# Patient Record
Sex: Male | Born: 1980 | Race: Black or African American | Hispanic: No | Marital: Single | Smoking: Never smoker
Health system: Southern US, Community
[De-identification: ages and names within clinical notes are randomized; demographics above are authoritative.]

---
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
## 2006-11-30 ENCOUNTER — Emergency Department (HOSPITAL_COMMUNITY): Admission: EM | Admit: 2006-11-30 | Discharge: 2006-12-01 | Payer: Self-pay | Admitting: Emergency Medicine

## 2006-12-07 ENCOUNTER — Emergency Department (HOSPITAL_COMMUNITY): Admission: EM | Admit: 2006-12-07 | Discharge: 2006-12-07 | Payer: Self-pay | Admitting: Emergency Medicine

## 2009-05-01 ENCOUNTER — Emergency Department (HOSPITAL_COMMUNITY): Admission: EM | Admit: 2009-05-01 | Discharge: 2009-05-01 | Payer: Self-pay | Admitting: Family Medicine

## 2013-09-21 ENCOUNTER — Emergency Department (INDEPENDENT_AMBULATORY_CARE_PROVIDER_SITE_OTHER)
Admission: EM | Admit: 2013-09-21 | Discharge: 2013-09-21 | Disposition: A | Discharge status: Home / Self Care | Payer: BC Managed Care – PPO | Source: Home / Self Care | Attending: Emergency Medicine | Admitting: Emergency Medicine

## 2013-09-21 ENCOUNTER — Encounter (HOSPITAL_COMMUNITY): Payer: Self-pay | Admitting: Emergency Medicine

## 2013-09-21 DIAGNOSIS — J029 Acute pharyngitis, unspecified: Secondary | ICD-10-CM

## 2013-09-21 LAB — POCT RAPID STREP A: Streptococcus, Group A Screen (Direct): NEGATIVE

## 2013-09-21 MED ORDER — PENICILLIN G BENZATHINE 1200000 UNIT/2ML IM SUSP
1.2000 10*6.[IU] | Freq: Once | INTRAMUSCULAR | Status: AC
  Administered 2013-09-21: 1.2 10*6.[IU] via INTRAMUSCULAR

## 2013-09-21 MED ORDER — ACETAMINOPHEN 325 MG PO TABS
650.0000 mg | ORAL_TABLET | Freq: Once | ORAL | Status: AC
  Administered 2013-09-21: 650 mg via ORAL

## 2013-09-21 MED ORDER — PENICILLIN G BENZATHINE 1200000 UNIT/2ML IM SUSP
INTRAMUSCULAR | Status: AC
  Filled 2013-09-21: qty 2

## 2013-09-21 MED ORDER — ACETAMINOPHEN 325 MG PO TABS
ORAL_TABLET | ORAL | Status: AC
  Filled 2013-09-21: qty 2

## 2013-09-21 NOTE — Discharge Instructions (Signed)
Pharyngitis °Pharyngitis is redness, pain, and swelling (inflammation) of your pharynx.  °CAUSES  °Pharyngitis is usually caused by infection. Most of the time, these infections are from viruses (viral) and are part of a cold. However, sometimes pharyngitis is caused by bacteria (bacterial). Pharyngitis can also be caused by allergies. Viral pharyngitis may be spread from person to person by coughing, sneezing, and personal items or utensils (cups, forks, spoons, toothbrushes). Bacterial pharyngitis may be spread from person to person by more intimate contact, such as kissing.  °SIGNS AND SYMPTOMS  °Symptoms of pharyngitis include:   °· Sore throat.   °· Tiredness (fatigue).   °· Low-grade fever.   °· Headache. °· Joint pain and muscle aches. °· Skin rashes. °· Swollen lymph nodes. °· Plaque-like film on throat or tonsils (often seen with bacterial pharyngitis). °DIAGNOSIS  °Your health care provider will ask you questions about your illness and your symptoms. Your medical history, along with a physical exam, is often all that is needed to diagnose pharyngitis. Sometimes, a rapid strep test is done. Other lab tests may also be done, depending on the suspected cause.  °TREATMENT  °Viral pharyngitis will usually get better in 3 4 days without the use of medicine. Bacterial pharyngitis is treated with medicines that kill germs (antibiotics).  °HOME CARE INSTRUCTIONS  °· Drink enough water and fluids to keep your urine clear or pale yellow.   °· Only take over-the-counter or prescription medicines as directed by your health care provider:   °· If you are prescribed antibiotics, make sure you finish them even if you start to feel better.   °· Do not take aspirin.   °· Get lots of rest.   °· Gargle with 8 oz of salt water (½ tsp of salt per 1 qt of water) as often as every 1 2 hours to soothe your throat.   °· Throat lozenges (if you are not at risk for choking) or sprays may be used to soothe your throat. °SEEK MEDICAL  CARE IF:  °· You have large, tender lumps in your neck. °· You have a rash. °· You cough up green, yellow-brown, or bloody spit. °SEEK IMMEDIATE MEDICAL CARE IF:  °· Your neck becomes stiff. °· You drool or are unable to swallow liquids. °· You vomit or are unable to keep medicines or liquids down. °· You have severe pain that does not go away with the use of recommended medicines. °· You have trouble breathing (not caused by a stuffy nose). °MAKE SURE YOU:  °· Understand these instructions. °· Will watch your condition. °· Will get help right away if you are not doing well or get worse. °Document Released: 03/29/2005 Document Revised: 01/17/2013 Document Reviewed: 12/04/2012 °Exit[employer]® Patient Information ©2014 [employer]. ° °

## 2013-09-21 NOTE — ED Notes (Signed)
C/o sore throat  States he doe have a headache, vomiting on Wednesday Andrew Galvan was taking for headache relief.  Patient is febrile 101

## 2013-09-21 NOTE — ED Provider Notes (Signed)
Medical screening examination/treatment/procedure(s) were performed by non-physician practitioner and as supervising physician I was immediately available for consultation/collaboration.  Yolanda Dockendorf, M.D.  Izak Anding C Marcell Chavarin, MD 09/21/13 2159 

## 2013-09-21 NOTE — ED Provider Notes (Signed)
CSN: [id number]     Arrival date & time 09/21/13  0804 History   First MD Initiated Contact with Patient 09/21/13 0825     Chief Complaint  Patient presents with  . Sore Throat   (Consider location/radiation/quality/duration/timing/severity/associated sxs/prior Treatment) HPI Comments: Both wife and son currently being treated for strep pharyngitis  Patient is a 33 y.o. male presenting with pharyngitis. The history is provided by the patient.  Sore Throat This is a new problem. Episode onset: began 3 days ago. The problem occurs constantly. The problem has been gradually worsening. Associated symptoms include headaches. Associated symptoms comments: +fever.    History reviewed. No pertinent past medical history. No past surgical history on file. No family history on file. History  Substance Use Topics  . Smoking status: Not on file  . Smokeless tobacco: Not on file  . Alcohol Use: Not on file    Review of Systems  Neurological: Positive for headaches.  All other systems reviewed and are negative.   Allergies  Review of patient's allergies indicates no known allergies.  Home Medications   Prior to Admission medications   Not on File   BP 147/83  Pulse 90  Temp(Src) 101.1 F (38.4 C) (Oral)  Resp 14  SpO2 99% Physical Exam  Nursing note and vitals reviewed. Constitutional: He is oriented to person, place, and time. He appears well-developed and well-nourished. No distress.  HENT:  Head: Normocephalic and atraumatic.  Right Ear: Hearing and external ear normal.  Left Ear: Hearing and external ear normal.  Mouth/Throat: Uvula is midline and mucous membranes are normal. Posterior oropharyngeal erythema present. No oropharyngeal exudate, posterior oropharyngeal edema or tonsillar abscesses.  Eyes: Conjunctivae are normal. No scleral icterus.  Neck: Normal range of motion. Neck supple.  Cardiovascular: Normal rate, regular rhythm and normal heart sounds.    Pulmonary/Chest: Effort normal and breath sounds normal.  Musculoskeletal: Normal range of motion.  Lymphadenopathy:    He has no cervical adenopathy.  Neurological: He is alert and oriented to person, place, and time.  Skin: Skin is warm and dry. No rash noted. No erythema.  Psychiatric: He has a normal mood and affect. His behavior is normal.    ED Course  Procedures (including critical care time) Labs Review Labs Reviewed  POCT RAPID STREP A ([facility] CARE ONLY)    Imaging Review No results found.   MDM   1. Pharyngitis   Given both his son and his wife have recently had throat cultures that were positive for strep, will treat patient empirically with 1.2 MU bicillin LA and advise follow up if no improvement.     Jess BartersJennifer Tondreau Bay [place], [place] 09/21/13 [phone]

## 2013-09-23 LAB — CULTURE, GROUP A STREP

## 2015-02-05 ENCOUNTER — Encounter (HOSPITAL_COMMUNITY): Payer: Self-pay

## 2015-02-05 ENCOUNTER — Emergency Department (HOSPITAL_COMMUNITY)
Admission: EM | Admit: 2015-02-05 | Discharge: 2015-02-05 | Disposition: A | Discharge status: Home / Self Care | Payer: BLUE CROSS/BLUE SHIELD | Attending: Emergency Medicine | Admitting: Emergency Medicine

## 2015-02-05 DIAGNOSIS — M545 Low back pain: Secondary | ICD-10-CM | POA: Diagnosis present

## 2015-02-05 DIAGNOSIS — M25512 Pain in left shoulder: Secondary | ICD-10-CM | POA: Insufficient documentation

## 2015-02-05 DIAGNOSIS — M5432 Sciatica, left side: Secondary | ICD-10-CM

## 2015-02-05 DIAGNOSIS — R197 Diarrhea, unspecified: Secondary | ICD-10-CM

## 2015-02-05 MED ORDER — PREDNISONE 20 MG PO TABS: 40.0000 mg | ORAL_TABLET | Freq: Every day | ORAL | Status: DC

## 2015-02-05 NOTE — Discharge Instructions (Signed)
Shoulder Pain The shoulder is the joint that connects your arms to your body. The bones that form the shoulder joint include the upper arm bone (humerus), the shoulder blade (scapula), and the collarbone (clavicle). The top of the humerus is shaped like a ball and fits into a rather flat socket on the scapula (glenoid cavity). A combination of muscles and strong, fibrous tissues that connect muscles to bones (tendons) support your shoulder joint and hold the ball in the socket. Small, fluid-filled sacs (bursae) are located in different areas of the joint. They act as cushions between the bones and the overlying soft tissues and help reduce friction between the gliding tendons and the bone as you move your arm. Your shoulder joint allows a wide range of motion in your arm. This range of motion allows you to do things like scratch your back or throw a ball. However, this range of motion also makes your shoulder more prone to pain from overuse and injury. Causes of shoulder pain can originate from both injury and overuse and usually can be grouped in the following four categories:  Redness, swelling, and pain (inflammation) of the tendon (tendinitis) or the bursae (bursitis).  Instability, such as a dislocation of the joint.  Inflammation of the joint (arthritis).  Broken bone (fracture). HOME CARE INSTRUCTIONS   Apply ice to the sore area.  Put ice in a plastic bag.  Place a towel between your skin and the bag.  Leave the ice on for 15-20 minutes, 3-4 times per day for the first 2 days, or as directed by your health care provider.  Stop using cold packs if they do not help with the pain.  If you have a shoulder sling or immobilizer, wear it as long as your caregiver instructs. Only remove it to shower or bathe. Move your arm as little as possible, but keep your hand moving to prevent swelling.  Squeeze a soft ball or foam pad as much as possible to help prevent swelling.  Only take  over-the-counter or prescription medicines for pain, discomfort, or fever as directed by your caregiver. SEEK MEDICAL CARE IF:   Your shoulder pain increases, or new pain develops in your arm, hand, or fingers.  Your hand or fingers become cold and numb.  Your pain is not relieved with medicines. SEEK IMMEDIATE MEDICAL CARE IF:   Your arm, hand, or fingers are numb or tingling.  Your arm, hand, or fingers are significantly swollen or turn white or blue. MAKE SURE YOU:   Understand these instructions.  Will watch your condition.  Will get help right away if you are not doing well or get worse.   This information is not intended to replace advice given to you by your health care provider. Make sure you discuss any questions you have with your health care provider.   Document Released: 01/06/2005 Document Revised: 04/19/2014 Document Reviewed: 07/22/2014 Elsevier Interactive Patient Education 2016 [employer]  Radicular Pain Radicular pain in either the arm or leg is usually from a bulging or herniated disk in the spine. A piece of the herniated disk may press against the nerves as the nerves exit the spine. This causes pain which is felt at the tips of the nerves down the arm or leg. Other causes of radicular pain may include:  Fractures.  Heart disease.  Cancer.  An abnormal and usually degenerative state of the nervous system or nerves (neuropathy). Diagnosis may require CT or MRI scanning to determine the primary  cause.  Nerves that start at the neck (nerve roots) may cause radicular pain in the outer shoulder and arm. It can spread down to the thumb and fingers. The symptoms vary depending on which nerve root has been affected. In most cases radicular pain improves with conservative treatment. Neck problems may require physical therapy, a neck collar, or cervical traction. Treatment may take many weeks, and surgery may be considered if the symptoms do not improve.    Conservative treatment is also recommended for sciatica. Sciatica causes pain to radiate from the lower back or buttock area down the leg into the foot. Often there is a history of back problems. Most patients with sciatica are better after 2 to 4 weeks of rest and other supportive care. Short term bed rest can reduce the disk pressure considerably. Sitting, however, is not a good position since this increases the pressure on the disk. You should avoid bending, lifting, and all other activities which make the problem worse. Traction can be used in severe cases. Surgery is usually reserved for patients who do not improve within the first months of treatment. Only take over-the-counter or prescription medicines for pain, discomfort, or fever as directed by your caregiver. Narcotics and muscle relaxants may help by relieving more severe pain and spasm and by providing mild sedation. Cold or massage can give significant relief. Spinal manipulation is not recommended. It can increase the degree of disc protrusion. Epidural steroid injections are often effective treatment for radicular pain. These injections deliver medicine to the spinal nerve in the space between the protective covering of the spinal cord and back bones (vertebrae). Your caregiver can give you more information about steroid injections. These injections are most effective when given within two weeks of the onset of pain.  You should see your caregiver for follow up care as recommended. A program for neck and back injury rehabilitation with stretching and strengthening exercises is an important part of management.  SEEK IMMEDIATE MEDICAL CARE IF:  You develop increased pain, weakness, or numbness in your arm or leg.  You develop difficulty with bladder or bowel control.  You develop abdominal pain.   This information is not intended to replace advice given to you by your health care provider. Make sure you discuss any questions you have with  your health care provider.   Document Released: 05/06/2004 Document Revised: 04/19/2014 Document Reviewed: 10/23/2014 Elsevier Interactive Patient Education 2016 [employer] Inc[employer]  Diarrhea Diarrhea is frequent loose and watery bowel movements. It can cause you to feel weak and dehydrated. Dehydration can cause you to become tired and thirsty, have a dry mouth, and have decreased urination that often is dark yellow. Diarrhea is a sign of another problem, most often an infection that will not last long. In most cases, diarrhea typically lasts 2-3 days. However, it can last longer if it is a sign of something more serious. It is important to treat your diarrhea as directed by your caregiver to lessen or prevent future episodes of diarrhea. CAUSES  Some common causes include:  Gastrointestinal infections caused by viruses, bacteria, or parasites.  Food poisoning or food allergies.  Certain medicines, such as antibiotics, chemotherapy, and laxatives.  Artificial sweeteners and fructose.  Digestive disorders. HOME CARE INSTRUCTIONS  Ensure adequate fluid intake (hydration): Have 1 cup (8 oz) of fluid for each diarrhea episode. Avoid fluids that contain simple sugars or sports drinks, fruit juices, whole milk products, and sodas. Your urine should be clear or pale yellow if  you are drinking enough fluids. Hydrate with an oral rehydration solution that you can purchase at pharmacies, retail stores, and online. You can prepare an oral rehydration solution at home by mixing the following ingredients together:   - tsp table salt.   tsp baking soda.   tsp salt substitute containing potassium chloride.  1  tablespoons sugar.  1 L (34 oz) of water.  Certain foods and beverages may increase the speed at which food moves through the gastrointestinal (GI) tract. These foods and beverages should be avoided and include:  Caffeinated and alcoholic beverages.  High-fiber foods, such as raw fruits and  vegetables, nuts, seeds, and whole grain breads and cereals.  Foods and beverages sweetened with sugar alcohols, such as xylitol, sorbitol, and mannitol.  Some foods may be well tolerated and may help thicken stool including:  Starchy foods, such as rice, toast, pasta, low-sugar cereal, oatmeal, grits, baked potatoes, crackers, and bagels.  Bananas.  Applesauce.  Add probiotic-rich foods to help increase healthy bacteria in the GI tract, such as yogurt and fermented milk products.  Wash your hands well after each diarrhea episode.  Only take over-the-counter or prescription medicines as directed by your caregiver.  Take a warm bath to relieve any burning or pain from frequent diarrhea episodes. SEEK IMMEDIATE MEDICAL CARE IF:   You are unable to keep fluids down.  You have persistent vomiting.  You have blood in your stool, or your stools are black and tarry.  You do not urinate in 6-8 hours, or there is only a small amount of very dark urine.  You have abdominal pain that increases or localizes.  You have weakness, dizziness, confusion, or light-headedness.  You have a severe headache.  Your diarrhea gets worse or does not get better.  You have a fever or persistent symptoms for more than 2-3 days.  You have a fever and your symptoms suddenly get worse. MAKE SURE YOU:   Understand these instructions.  Will watch your condition.  Will get help right away if you are not doing well or get worse.   This information is not intended to replace advice given to you by your health care provider. Make sure you discuss any questions you have with your health care provider.   Document Released: 03/19/2002 Document Revised: 04/19/2014 Document Reviewed: 12/05/2011 Elsevier Interactive Patient Education [employer]

## 2015-02-05 NOTE — ED Provider Notes (Signed)
CSN: [id number]     Arrival date & time 02/05/15  1012 History   First MD Initiated Contact with Patient 02/05/15 1037     Chief Complaint  Patient presents with  . Leg Pain  . Back Pain  . Diarrhea     (Consider location/radiation/quality/duration/timing/severity/associated sxs/prior Treatment) HPI Comments: Patient presents to the emergency department with chief complaint of multiple complaints.  1. Low back pain and leg pain: Patient states that he has been having these symptoms for the past 3 weeks. He denies any associated bowel or bladder incontinence. States that he performs heavy lifting at work. He rates his pain as a 7 out of 10. Symptoms are aggravated with movement. Denies taking anything for her symptoms. Denies any history of back surgery.  2. Left shoulder pain: Patient reports left shoulder pain 3 weeks. Denies any known mechanism of injury. Person with movement.  Describes symptoms as a "soreness." Has not taken anything for his symptoms.  3. Diarrhea:  Reports diarrhea 3 days ago.  Symptoms have resolved.  No abdominal pain or associated symptoms.  The history is provided by the patient. No language interpreter was used.    History reviewed. No pertinent past medical history. History reviewed. No pertinent past surgical history. History reviewed. No pertinent family history. Social History  Substance Use Topics  . Smoking status: Never Smoker   . Smokeless tobacco: None  . Alcohol Use: Yes     Comment: occ    Review of Systems  Constitutional: Negative for fever and chills.  Gastrointestinal:       No bowel incontinence  Genitourinary:       No urinary incontinence  Musculoskeletal: Positive for myalgias, back pain and arthralgias.  Neurological:       No saddle anesthesia  All other systems reviewed and are negative.     Allergies  Dairy aid  Home Medications   Prior to Admission medications   Medication Sig Start Date End Date Taking?  Authorizing Provider  acetaminophen (TYLENOL) 500 MG tablet Take 1,000 mg by mouth every 6 (six) hours as needed (For pain.).   Yes Historical Provider, MD   BP 144/79 mmHg  Pulse 55  Temp(Src) 97.9 F (36.6 C) (Oral)  Resp 14  SpO2 100% Physical Exam  Constitutional: He is oriented to person, place, and time. He appears well-developed and well-nourished. No distress.  HENT:  Head: Normocephalic and atraumatic.  Eyes: Conjunctivae and EOM are normal. Right eye exhibits no discharge. Left eye exhibits no discharge. No scleral icterus.  Neck: Normal range of motion. Neck supple. No tracheal deviation present.  Cardiovascular: Normal rate, regular rhythm and normal heart sounds.  Exam reveals no gallop and no friction rub.   No murmur heard. Pulmonary/Chest: Effort normal and breath sounds normal. No respiratory distress. He has no wheezes.  Abdominal: Soft. He exhibits no distension. There is no tenderness.  Musculoskeletal: Normal range of motion.  Lumbar paraspinal muscles tender to palpation, no bony tenderness, step-offs, or gross abnormality or deformity of spine, patient is able to ambulate, moves all extremities  Bilateral great toe extension intact Bilateral plantar/dorsiflexion intact  Left shoulder ROM and strength 5/5, negative empty can test, negative Hawkins-Kennedy test, no bony abnormality or deformity  Neurological: He is alert and oriented to person, place, and time. He has normal reflexes.  Sensation and strength intact bilaterally Symmetrical reflexes  Skin: Skin is warm. He is not diaphoretic.  Psychiatric: He has a normal mood and affect. His behavior  is normal. Judgment and thought content normal.  Nursing note and vitals reviewed.   ED Course  Procedures (including critical care time)   MDM   Final diagnoses:  Sciatica of left side  Left shoulder pain  Diarrhea, unspecified type    Patient with multiple complaints.    1. Back pain and leg pain seem  to be sciatica.  No cauda equina symptoms.  No fever.  Will give prednisone and recommend ortho follow-up.  2. Left shoulder pain seems musculoskeletal.  No indication for imaging in ED based on normal ROM and strength and no tenderness to palpation.  Recommend ortho follow-up.  3. Diarrhea has resolved.  No abdominal pain.  VSS.    Roxy Horsemanobert Unique Searfoss, PA-C 02/05/15 1125  Eber HongBrian Miller, MD 02/06/15 [phone]

## 2015-02-05 NOTE — ED Notes (Signed)
Pt c/o intermittent lower back pain and L shoulder pain x 3 weeks, L leg pain x 2 days, and diarrhea x 3 days.  Pain score 7/10.  Pt has not taken anything for pain.  Denies injury.  Denies GU complaint.  Denies abdominal pain and n/v. Denies numbness and tingling.

## 2015-06-09 ENCOUNTER — Encounter (HOSPITAL_COMMUNITY): Payer: Self-pay

## 2015-06-09 ENCOUNTER — Emergency Department (HOSPITAL_COMMUNITY)
Admission: EM | Admit: 2015-06-09 | Discharge: 2015-06-09 | Disposition: A | Discharge status: Home / Self Care | Payer: BLUE CROSS/BLUE SHIELD | Attending: Emergency Medicine | Admitting: Emergency Medicine

## 2015-06-09 DIAGNOSIS — S4992XA Unspecified injury of left shoulder and upper arm, initial encounter: Secondary | ICD-10-CM | POA: Diagnosis present

## 2015-06-09 DIAGNOSIS — Y998 Other external cause status: Secondary | ICD-10-CM | POA: Diagnosis not present

## 2015-06-09 DIAGNOSIS — Z7952 Long term (current) use of systemic steroids: Secondary | ICD-10-CM | POA: Insufficient documentation

## 2015-06-09 DIAGNOSIS — Y9389 Activity, other specified: Secondary | ICD-10-CM | POA: Insufficient documentation

## 2015-06-09 DIAGNOSIS — Y92410 Unspecified street and highway as the place of occurrence of the external cause: Secondary | ICD-10-CM | POA: Diagnosis not present

## 2015-06-09 DIAGNOSIS — S3992XA Unspecified injury of lower back, initial encounter: Secondary | ICD-10-CM | POA: Diagnosis not present

## 2015-06-09 DIAGNOSIS — S40212A Abrasion of left shoulder, initial encounter: Secondary | ICD-10-CM | POA: Diagnosis not present

## 2015-06-09 DIAGNOSIS — M549 Dorsalgia, unspecified: Secondary | ICD-10-CM

## 2015-06-09 DIAGNOSIS — S29002A Unspecified injury of muscle and tendon of back wall of thorax, initial encounter: Secondary | ICD-10-CM | POA: Diagnosis not present

## 2015-06-09 DIAGNOSIS — S199XXA Unspecified injury of neck, initial encounter: Secondary | ICD-10-CM | POA: Diagnosis not present

## 2015-06-09 DIAGNOSIS — S29001A Unspecified injury of muscle and tendon of front wall of thorax, initial encounter: Secondary | ICD-10-CM | POA: Insufficient documentation

## 2015-06-09 MED ORDER — CYCLOBENZAPRINE HCL 10 MG PO TABS
10.0000 mg | ORAL_TABLET | Freq: Two times a day (BID) | ORAL | Status: DC | PRN
Start: 2015-06-09 — End: 2017-09-12

## 2015-06-09 MED ORDER — IBUPROFEN 800 MG PO TABS: 800.0000 mg | ORAL_TABLET | Freq: Three times a day (TID) | ORAL | Status: DC

## 2015-06-09 MED ORDER — TRAMADOL HCL 50 MG PO TABS: 50.0000 mg | ORAL_TABLET | Freq: Four times a day (QID) | ORAL | Status: AC | PRN

## 2015-06-09 NOTE — ED Notes (Signed)
Patient was alert, oriented and stable upon discharge. RN went over AVS and patient had no further questions.  
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
## 2015-12-25 ENCOUNTER — Encounter (HOSPITAL_COMMUNITY): Payer: Self-pay | Admitting: Emergency Medicine

## 2015-12-25 ENCOUNTER — Ambulatory Visit (HOSPITAL_COMMUNITY)
Admission: EM | Admit: 2015-12-25 | Discharge: 2015-12-25 | Discharge status: Against Medical Advice | Payer: BLUE CROSS/BLUE SHIELD

## 2015-12-25 NOTE — ED Notes (Signed)
Pt came outside and reports he needed to leave.... Had to p/u children.

## 2015-12-25 NOTE — ED Triage Notes (Signed)
Pt c/o intermittent rash on random parts of body... Today rash is on neck  Denies fevers, chills.... A&O x4... NAD

## 2016-01-21 ENCOUNTER — Encounter (HOSPITAL_COMMUNITY): Payer: Self-pay

## 2016-01-21 ENCOUNTER — Emergency Department (HOSPITAL_COMMUNITY)
Admission: EM | Admit: 2016-01-21 | Discharge: 2016-01-21 | Disposition: A | Discharge status: Home / Self Care | Payer: BLUE CROSS/BLUE SHIELD | Attending: Emergency Medicine | Admitting: Emergency Medicine

## 2016-01-21 DIAGNOSIS — L509 Urticaria, unspecified: Secondary | ICD-10-CM | POA: Insufficient documentation

## 2016-01-21 DIAGNOSIS — R1033 Periumbilical pain: Secondary | ICD-10-CM | POA: Diagnosis not present

## 2016-01-21 DIAGNOSIS — R319 Hematuria, unspecified: Secondary | ICD-10-CM | POA: Diagnosis not present

## 2016-01-21 DIAGNOSIS — Z791 Long term (current) use of non-steroidal anti-inflammatories (NSAID): Secondary | ICD-10-CM | POA: Diagnosis not present

## 2016-01-21 DIAGNOSIS — R21 Rash and other nonspecific skin eruption: Secondary | ICD-10-CM | POA: Diagnosis present

## 2016-01-21 LAB — URINALYSIS, ROUTINE W REFLEX MICROSCOPIC
Bilirubin Urine: NEGATIVE
Glucose, UA: NEGATIVE mg/dL
Hgb urine dipstick: NEGATIVE
Ketones, ur: NEGATIVE mg/dL
Leukocytes, UA: NEGATIVE
Nitrite: NEGATIVE
Protein, ur: 30 mg/dL — AB
Specific Gravity, Urine: 1.022 (ref 1.005–1.030)
pH: 6 (ref 5.0–8.0)

## 2016-01-21 LAB — URINE MICROSCOPIC-ADD ON: Squamous Epithelial / LPF: NONE SEEN

## 2016-01-21 MED ORDER — PREDNISONE 10 MG PO TABS: 20.0000 mg | ORAL_TABLET | Freq: Every day | ORAL | 0 refills | Status: DC

## 2016-01-21 MED ORDER — PREDNISONE 20 MG PO TABS: 60.0000 mg | ORAL_TABLET | Freq: Every day | ORAL | 0 refills | Status: AC

## 2016-01-21 MED ORDER — PREDNISONE 20 MG PO TABS: 60.0000 mg | ORAL_TABLET | Freq: Every day | ORAL | 0 refills | Status: DC

## 2016-01-21 NOTE — Progress Notes (Addendum)
Pt has a rash on both arms and his legs. Pt does not appear in distress. Urine sent(12:45p)Waiting for PA to verfiy prescriptions. (1:45p)

## 2016-01-21 NOTE — ED Triage Notes (Signed)
Pt here with rash leg.  Pt has had off/on x 2 months

## 2016-01-21 NOTE — Discharge Instructions (Signed)
Medications: Prednisone  Treatment: Take prednisone as prescribed for 5 days. Continue taking Claritin and Benadryl as prescribed over-the-counter. You will be called if your urine returns positive for any disease that required treatment. If you are positive, please follow-up with your primary care provider's office or the health department for treatment.  Follow-up: Please follow-up with your primary care provider your scheduled appointment. He may need allergy testing to determine why you are having this allergic rash. Please return to emergency department if you develop any new or worsening symptoms.

## 2016-01-21 NOTE — ED Notes (Signed)
Bed: WA26 Expected date:  Expected time:  Means of arrival:  Comments: 

## 2016-01-21 NOTE — ED Provider Notes (Signed)
[facility]-EMERGENCY DEPT Provider Note   CSN: [id number] Arrival date & time: 01/21/16  1026  By signing my name below, I, Andrew Galvan, attest that this documentation has been prepared under the direction and in the presence of Andrew Electriclexandra Harneet Noblett, PA-C.  Electronically Signed: Placido SouLogan Galvan, ED Scribe. 01/21/16. 12:28 PM.    History   Chief Complaint Chief Complaint  Patient presents with  . Rash    HPI HPI Comments: Barton FannyJames M Andrew Galvan is a 35 y.o. male who presents to the Emergency Department complaining of an intermittent, mild, rash x 2 months. He states his rash intermittently occurs across his back and extremities and presents on a nearly daily basis. Pt states he has taken Zofran, Claritin, benadryl, and applied calamine lotion w/o long term relief. Pt reports associated itchiness and burning pain across the affected regions. He denies any new foods, clothing, soaps or detergents. He also complains of mild periumbilical pain that only presents with palpation and he describes as soreness. He states he works at [employer] and does a lot of lifting. Patient is also concerned one occurrence of hematuria last week stating that he "urinated and noticed one drop of blood". Pt denies a h/o STD. He has an appointment with a new PCP on 02/02/2016. He denies any other urinary issues or other associated symptoms at this time.   The history is provided by the patient. No language interpreter was used.    History reviewed. No pertinent past medical history.  There are no active problems to display for this patient.   History reviewed. No pertinent surgical history.   Home Medications    Prior to Admission medications   Medication Sig Start Date End Date Taking? Authorizing Provider  acetaminophen (TYLENOL) 500 MG tablet Take 1,000 mg by mouth every 6 (six) hours as needed (For pain.).    Historical Provider, MD  cyclobenzaprine (FLEXERIL) 10 MG tablet Take 1 tablet (10 mg total) by mouth 2 (two) times  daily as needed for muscle spasms. 06/09/15   Danelle BerryLeisa Tapia, PA-C  ibuprofen (ADVIL,MOTRIN) 800 MG tablet Take 1 tablet (800 mg total) by mouth 3 (three) times daily. 06/09/15   Danelle BerryLeisa Tapia, PA-C  predniSONE (DELTASONE) 20 MG tablet Take 3 tablets (60 mg total) by mouth daily. 01/21/16   Emi HolesAlexandra M Denzell Colasanti, PA-C  traMADol (ULTRAM) 50 MG tablet Take 1 tablet (50 mg total) by mouth every 6 (six) hours as needed. 06/09/15   Danelle BerryLeisa Tapia, PA-C    Family History Family History  Problem Relation Age of Onset  . Cancer Mother     Social History Social History  Substance Use Topics  . Smoking status: Never Smoker  . Smokeless tobacco: Never Used  . Alcohol use Yes     Comment: occ     Allergies   Dairy aid [lactase]   Review of Systems Review of Systems  Constitutional: Negative for chills and fever.  HENT: Negative for drooling and trouble swallowing.   Respiratory: Negative for cough, shortness of breath, wheezing and stridor.   Gastrointestinal: Positive for abdominal pain. Negative for nausea and vomiting.  Genitourinary: Positive for hematuria (1 episode, now resolved). Negative for difficulty urinating, discharge, dysuria, frequency, penile pain, penile swelling, scrotal swelling, testicular pain and urgency.  Musculoskeletal: Positive for myalgias.  Skin: Positive for color change and rash.   Physical Exam Updated Vital Signs BP 136/96 (BP Location: Right Arm)   Pulse 77   Temp 98.9 F (37.2 C) (Oral)   Resp 14  Ht 5\' 5"  (1.651 m)   Wt 70.3 kg   SpO2 100%   BMI 25.79 kg/m   Physical Exam  Constitutional: He appears well-developed and well-nourished. No distress.  HENT:  Head: Normocephalic and atraumatic.  Mouth/Throat: Oropharynx is clear and moist. No oropharyngeal exudate.  Patent airway; no swelling of the lips, tongue, pharynx; no lesions in the mouth  Eyes: Conjunctivae are normal. Pupils are equal, round, and reactive to light. Right eye exhibits no discharge.  Left eye exhibits no discharge. No scleral icterus.  Neck: Normal range of motion. Neck supple. No thyromegaly present.  Cardiovascular: Normal rate, regular rhythm, normal heart sounds and intact distal pulses.  Exam reveals no gallop and no friction rub.   No murmur heard. Pulmonary/Chest: Effort normal and breath sounds normal. No stridor. No respiratory distress. He has no wheezes. He has no rales.  Abdominal: Soft. Bowel sounds are normal. He exhibits no distension. There is no rebound and no guarding.    Mild tenderness (patient described as soreness) to the periumbilical area  Musculoskeletal: He exhibits no edema.  Lymphadenopathy:    He has no cervical adenopathy.  Neurological: He is alert. Coordination normal.  Skin: Skin is warm and dry. No rash noted. He is not diaphoretic. No pallor.  Psychiatric: He has a normal mood and affect.  Nursing note and vitals reviewed.        ED Treatments / Results  Labs (all labs ordered are listed, but only abnormal results are displayed) Labs Reviewed  URINALYSIS, ROUTINE W REFLEX MICROSCOPIC (NOT AT [facility]) - Abnormal; Notable for the following:       Result Value   APPearance CLOUDY (*)    Protein, ur 30 (*)    All other components within normal limits  URINE MICROSCOPIC-ADD ON - Abnormal; Notable for the following:    Bacteria, UA MANY (*)    All other components within normal limits  GC/CHLAMYDIA PROBE AMP ([place]) NOT AT [facility]    EKG  EKG Interpretation None       Radiology No results found.  Procedures Procedures  DIAGNOSTIC STUDIES: Oxygen Saturation is 100% on RA, normal by my interpretation.    COORDINATION OF CARE: 12:28 PM Discussed next steps with pt. Pt verbalized understanding and is agreeable with the plan.    Medications Ordered in ED Medications - No data to display   Initial Impression / Assessment and Plan / ED Course  I have reviewed the triage vital signs and the nursing  notes.  Pertinent labs & imaging results that were available during my care of the patient were reviewed by me and considered in my medical decision making (see chart for details).  Clinical Course    Patient with urticaria of unknown origin. Patient's abdominal tenderness most likely muscular, as it is not present unless it is palpated. Patient describes this as soreness. We'll treat patient's urticaria with 5 day burst of prednisone. Patient advised to follow-up with PCP at scheduled appointment and ask for allergy testing. UA shows many bacteria, however no hematuria, leukocytes, nitrites, or squamous epithelial cells. Patient has no concern for STD exposure, however GC/chlamydia urine sent. Patient advised he will be called in 2-3 days for positive results. He was advised to seek treatment at his PCP or Health Department if positive. Strict return precautions given for above symptoms. Patient understands and agrees with plan. Patient vitals stable throughout ED course and discharged in satisfactory condition.  I personally performed the services described in  this documentation, which was scribed in my presence. The recorded information has been reviewed and is accurate.  Final Clinical Impressions(s) / ED Diagnoses   Final diagnoses:  Urticaria    New Prescriptions Discharge Medication List as of 01/21/2016  1:40 PM       Emi Holes, PA-C 01/21/16 1610    Pricilla Loveless, MD 01/27/16 1818

## 2016-03-12 ENCOUNTER — Ambulatory Visit: Payer: Self-pay | Admitting: Allergy

## 2016-05-03 ENCOUNTER — Emergency Department (HOSPITAL_COMMUNITY)
Admission: EM | Admit: 2016-05-03 | Discharge: 2016-05-03 | Disposition: A | Discharge status: Home / Self Care | Payer: BLUE CROSS/BLUE SHIELD | Attending: Emergency Medicine | Admitting: Emergency Medicine

## 2016-05-03 ENCOUNTER — Emergency Department (HOSPITAL_COMMUNITY): Payer: BLUE CROSS/BLUE SHIELD

## 2016-05-03 ENCOUNTER — Encounter (HOSPITAL_COMMUNITY): Payer: Self-pay | Admitting: Emergency Medicine

## 2016-05-03 DIAGNOSIS — S62630A Displaced fracture of distal phalanx of right index finger, initial encounter for closed fracture: Secondary | ICD-10-CM | POA: Diagnosis not present

## 2016-05-03 DIAGNOSIS — S61309A Unspecified open wound of unspecified finger with damage to nail, initial encounter: Secondary | ICD-10-CM

## 2016-05-03 DIAGNOSIS — Y939 Activity, unspecified: Secondary | ICD-10-CM | POA: Insufficient documentation

## 2016-05-03 DIAGNOSIS — S6991XA Unspecified injury of right wrist, hand and finger(s), initial encounter: Secondary | ICD-10-CM | POA: Diagnosis present

## 2016-05-03 DIAGNOSIS — Y929 Unspecified place or not applicable: Secondary | ICD-10-CM | POA: Insufficient documentation

## 2016-05-03 DIAGNOSIS — W231XXA Caught, crushed, jammed, or pinched between stationary objects, initial encounter: Secondary | ICD-10-CM | POA: Diagnosis not present

## 2016-05-03 DIAGNOSIS — Z79899 Other long term (current) drug therapy: Secondary | ICD-10-CM | POA: Diagnosis not present

## 2016-05-03 DIAGNOSIS — Y990 Civilian activity done for income or pay: Secondary | ICD-10-CM | POA: Diagnosis not present

## 2016-05-03 DIAGNOSIS — S62639B Displaced fracture of distal phalanx of unspecified finger, initial encounter for open fracture: Secondary | ICD-10-CM

## 2016-05-03 MED ORDER — HYDROCODONE-ACETAMINOPHEN 5-325 MG PO TABS: 2.0000 | ORAL_TABLET | ORAL | 0 refills | Status: DC | PRN

## 2016-05-03 MED ORDER — CEPHALEXIN 250 MG PO CAPS
500.0000 mg | ORAL_CAPSULE | Freq: Once | ORAL | Status: AC
  Administered 2016-05-03: 500 mg via ORAL
  Filled 2016-05-03: qty 2

## 2016-05-03 MED ORDER — CEPHALEXIN 500 MG PO CAPS: 500.0000 mg | ORAL_CAPSULE | Freq: Three times a day (TID) | ORAL | 0 refills | Status: DC

## 2016-05-03 NOTE — ED Notes (Signed)
Patient transported to X-ray 

## 2016-05-03 NOTE — Discharge Instructions (Signed)
Call Dr. Janee Mornhompson for follow up appointment.  Wound check in ER in 48 hours unless able to be seen by Dr. Janee Mornhompson this week.

## 2016-05-03 NOTE — ED Triage Notes (Signed)
Pt sts right index finger pain from getting smashed at work; bleeding controlled

## 2016-05-03 NOTE — ED Provider Notes (Signed)
[facility]-EMERG[facility] DEPT Provider Note   CSN: [id number] Arrival date & time: 05/03/16  1458  By signing my name below, I, Freida Busman, attest that this documentation has been prepared under the direction and in the presence of Rolland Porter, MD . Electronically Signed: Freida Busman, Scribe. 05/03/2016. 4:21 PM.  History   Chief Complaint Chief Complaint  Patient presents with  . Finger Injury     The history is provided by the patient. No language interpreter was used.   HPI Comments:  Andrew Galvan is a right hand dominant 36 y.o. male who presents to the Emergency Department complaining of an injury to the right index finger which occurred just PTA while at work. He reports constant 10/10 pain to the site. He describes a crush injury where his finger became caught between a pole and a cart. Pt has no other complaints or associated symptoms at this time.    History reviewed. No pertinent past medical history.  There are no active problems to display for this patient.   History reviewed. No pertinent surgical history.     Home Medications    Prior to Admission medications   Medication Sig Start Date End Date Taking? Authorizing Provider  acetaminophen (TYLENOL) 500 MG tablet Take 1,000 mg by mouth every 6 (six) hours as needed (For pain.).    Historical Provider, MD  cephALEXin (KEFLEX) 500 MG capsule Take 1 capsule (500 mg total) by mouth 3 (three) times daily. 05/03/16   Rolland Porter, MD  cyclobenzaprine (FLEXERIL) 10 MG tablet Take 1 tablet (10 mg total) by mouth 2 (two) times daily as needed for muscle spasms. 06/09/15   Danelle Berry, PA-C  HYDROcodone-acetaminophen (NORCO/VICODIN) 5-325 MG tablet Take 2 tablets by mouth every 4 (four) hours as needed. 05/03/16   Rolland Porter, MD  ibuprofen (ADVIL,MOTRIN) 800 MG tablet Take 1 tablet (800 mg total) by mouth 3 (three) times daily. 06/09/15   Danelle Berry, PA-C  predniSONE (DELTASONE) 20 MG tablet Take 3 tablets (60 mg total) by mouth  daily. 01/21/16   Emi Holes, PA-C  traMADol (ULTRAM) 50 MG tablet Take 1 tablet (50 mg total) by mouth every 6 (six) hours as needed. 06/09/15   Danelle Berry, PA-C    Family History Family History  Problem Relation Age of Onset  . Cancer Mother     Social History Social History  Substance Use Topics  . Smoking status: Never Smoker  . Smokeless tobacco: Never Used  . Alcohol use Yes     Comment: occ     Allergies   Dairy aid [lactase]   Review of Systems Review of Systems  Constitutional: Negative for appetite change, chills, diaphoresis, fatigue and fever.  HENT: Negative for mouth sores, sore throat and trouble swallowing.   Eyes: Negative for visual disturbance.  Respiratory: Negative for cough, chest tightness, shortness of breath and wheezing.   Cardiovascular: Negative for chest pain.  Gastrointestinal: Negative for abdominal distention, abdominal pain, diarrhea, nausea and vomiting.  Endocrine: Negative for polydipsia, polyphagia and polyuria.  Genitourinary: Negative for dysuria, frequency and hematuria.  Musculoskeletal: Negative for gait problem.  Skin: Positive for wound. Negative for color change, pallor and rash.  Neurological: Negative for dizziness, syncope, light-headedness and headaches.  Hematological: Does not bruise/bleed easily.  Psychiatric/Behavioral: Negative for behavioral problems and confusion.     Physical Exam Updated Vital Signs BP 146/88 (BP Location: Left Arm)   Pulse 81   Temp 98 F (36.7 C) (Oral)   Resp  18   Ht 5\' 5"  (1.651 m)   Wt 160 lb (72.6 kg)   SpO2 100%   BMI 26.63 kg/m   Physical Exam  Constitutional: He is oriented to person, place, and time. He appears well-developed and well-nourished. No distress.  HENT:  Head: Normocephalic.  Eyes: Conjunctivae are normal. Pupils are equal, round, and reactive to light. No scleral icterus.  Neck: Normal range of motion. Neck supple. No thyromegaly present.  Cardiovascular:  Normal rate and regular rhythm.  Exam reveals no gallop and no friction rub.   No murmur heard. Pulmonary/Chest: Effort normal and breath sounds normal. No respiratory distress. He has no wheezes. He has no rales.  Abdominal: Soft. Bowel sounds are normal. He exhibits no distension. There is no tenderness. There is no rebound.  Musculoskeletal: Normal range of motion.  Neurological: He is alert and oriented to person, place, and time.  Skin: Skin is warm and dry. No rash noted.  Avulsion of nail proximally on right index finger    Psychiatric: He has a normal mood and affect. His behavior is normal.  Nursing note and vitals reviewed.    ED Treatments / Results  DIAGNOSTIC STUDIES:  Oxygen Saturation is 100% on RA, normal by my interpretation.    COORDINATION OF CARE:  4:20 PM Discussed treatment plan with pt at bedside and pt agreed to plan.  Labs (all labs ordered are listed, but only abnormal results are displayed) Labs Reviewed - No data to display  EKG  EKG Interpretation None       Radiology No results found.  Procedures Procedures (including critical care time)  NERVE BLOCK Performed by: Rolland Porter, MD Consent: Verbal consent obtained. Consent given by: patient Time out: Immediately prior to procedure a "time out" was called to verify the correct patient, procedure, equipment, support staff and site/side marked as required. Indication: pain control Nerve block body site:  Preparation: Patient was prepped and draped in the usual sterile fashion. Needle gauge: 27 Location technique: anatomical landmarks Local anesthetic: marcaine without epi 0.5% Anesthetic total: 4 ml Outcome: pain improved Patient tolerance: Patient tolerated the procedure well with no immediate complications.  Patient tolerated procedure well without complications. Instructions for care discussed verbally and patient provided with additional written instructions for homecare and  f/u.  Medications Ordered in ED  Medications  cephALEXin (KEFLEX) capsule 500 mg (500 mg Oral Given 05/03/16 1736)     Initial Impression / Assessment and Plan / ED Course  I have reviewed the triage vital signs and the nursing notes.  Pertinent labs & imaging results that were available during my care of the patient were reviewed by me and considered in my medical decision making (see chart for details).     Transfusion at the proximal nail was able to be placed back under the hyponychium. Nail is sutured in place with 2 Vicryl sutures.  Non adherent dressing applied. He will be continued on Keflex. And surgical follow-up.  Final Clinical Impressions(s) / ED Diagnoses   Final diagnoses:  Open fracture of tuft of distal phalanx of finger  Partial avulsion of fingernail, initial encounter    New Prescriptions Discharge Medication List as of 05/03/2016  5:33 PM    START taking these medications   Details  cephALEXin (KEFLEX) 500 MG capsule Take 1 capsule (500 mg total) by mouth 3 (three) times daily., Starting Mon 05/03/2016, Print    HYDROcodone-acetaminophen (NORCO/VICODIN) 5-325 MG tablet Take 2 tablets by mouth every 4 (four) hours  as needed., Starting Mon 05/03/2016, Print       I personally performed the services described in this documentation, which was scribed in my presence. The recorded information has been reviewed and is accurate.     Rolland PorterMark Hendryx, MD 05/13/16 [phone]

## 2016-05-13 ENCOUNTER — Ambulatory Visit (HOSPITAL_COMMUNITY)
Admission: EM | Admit: 2016-05-13 | Discharge: 2016-05-13 | Disposition: A | Discharge status: Home / Self Care | Payer: BLUE CROSS/BLUE SHIELD

## 2016-06-22 ENCOUNTER — Emergency Department (HOSPITAL_COMMUNITY)
Admission: EM | Admit: 2016-06-22 | Discharge: 2016-06-22 | Discharge status: Against Medical Advice | Payer: BLUE CROSS/BLUE SHIELD

## 2016-06-22 NOTE — ED Notes (Signed)
Bed: [facility] Expected date:  Expected time:  Means of arrival:  Comments:   Velia MeyerMegan E Adie Vilar, EMT 06/22/16 1534

## 2016-06-22 NOTE — ED Notes (Signed)
Patient called from lobby. No answer.  

## 2016-06-22 NOTE — ED Notes (Signed)
Went to triage patient and pt is not in room. Unable to triage at this time.

## 2016-07-18 ENCOUNTER — Emergency Department (HOSPITAL_COMMUNITY): Payer: BLUE CROSS/BLUE SHIELD

## 2016-07-18 ENCOUNTER — Emergency Department (HOSPITAL_COMMUNITY)
Admission: EM | Admit: 2016-07-18 | Discharge: 2016-07-18 | Disposition: A | Discharge status: Home / Self Care | Payer: BLUE CROSS/BLUE SHIELD | Attending: Emergency Medicine | Admitting: Emergency Medicine

## 2016-07-18 DIAGNOSIS — Z79899 Other long term (current) drug therapy: Secondary | ICD-10-CM | POA: Diagnosis not present

## 2016-07-18 DIAGNOSIS — M545 Low back pain, unspecified: Secondary | ICD-10-CM

## 2016-07-18 DIAGNOSIS — R1031 Right lower quadrant pain: Secondary | ICD-10-CM | POA: Diagnosis not present

## 2016-07-18 LAB — COMPREHENSIVE METABOLIC PANEL
ALT: 22 U/L (ref 17–63)
AST: 27 U/L (ref 15–41)
Albumin: 4.5 g/dL (ref 3.5–5.0)
Alkaline Phosphatase: 64 U/L (ref 38–126)
Anion gap: 5 (ref 5–15)
BUN: 9 mg/dL (ref 6–20)
CO2: 28 mmol/L (ref 22–32)
Calcium: 9.2 mg/dL (ref 8.9–10.3)
Chloride: 104 mmol/L (ref 101–111)
Creatinine, Ser: 1.07 mg/dL (ref 0.61–1.24)
GFR calc Af Amer: >60 mL/min (ref >60)
GFR calc non Af Amer: >60 mL/min (ref >60)
Glucose, Bld: 89 mg/dL (ref 65–99)
Potassium: 4.1 mmol/L (ref 3.5–5.1)
Sodium: 137 mmol/L (ref 135–145)
Total Bilirubin: 1.9 mg/dL — ABNORMAL HIGH (ref 0.3–1.2)
Total Protein: 8.2 g/dL — ABNORMAL HIGH (ref 6.5–8.1)

## 2016-07-18 LAB — URINALYSIS, ROUTINE W REFLEX MICROSCOPIC
Bilirubin Urine: NEGATIVE
Glucose, UA: NEGATIVE mg/dL
Hgb urine dipstick: NEGATIVE
Ketones, ur: NEGATIVE mg/dL
Leukocytes, UA: NEGATIVE
Nitrite: NEGATIVE
Protein, ur: NEGATIVE mg/dL
Specific Gravity, Urine: 1.014 (ref 1.005–1.030)
pH: 8 (ref 5.0–8.0)

## 2016-07-18 LAB — CBC WITH DIFFERENTIAL/PLATELET
Basophils Absolute: 0 10*3/uL (ref 0.0–0.1)
Basophils Relative: 0 %
Eosinophils Absolute: 0.2 10*3/uL (ref 0.0–0.7)
Eosinophils Relative: 3 %
HCT: 46.3 % (ref 39.0–52.0)
Hemoglobin: 15.8 g/dL (ref 13.0–17.0)
Lymphocytes Relative: 21 %
Lymphs Abs: 1.6 10*3/uL (ref 0.7–4.0)
MCH: 29.4 pg (ref 26.0–34.0)
MCHC: 34.1 g/dL (ref 30.0–36.0)
MCV: 86.2 fL (ref 78.0–100.0)
Monocytes Absolute: 0.5 10*3/uL (ref 0.1–1.0)
Monocytes Relative: 7 %
Neutro Abs: 5.2 10*3/uL (ref 1.7–7.7)
Neutrophils Relative %: 69 %
Platelets: 285 10*3/uL (ref 150–400)
RBC: 5.37 MIL/uL (ref 4.22–5.81)
RDW: 12.2 % (ref 11.5–15.5)
WBC: 7.5 10*3/uL (ref 4.0–10.5)

## 2016-07-18 LAB — LIPASE, BLOOD: Lipase: 25 U/L (ref 11–51)

## 2016-07-18 MED ORDER — IOPAMIDOL (ISOVUE-300) INJECTION 61%
INTRAVENOUS | Status: AC
  Administered 2016-07-18: 100 mL via INTRAVENOUS
  Filled 2016-07-18: qty 100

## 2016-07-18 MED ORDER — NAPROXEN 500 MG PO TABS: 500.0000 mg | ORAL_TABLET | Freq: Two times a day (BID) | ORAL | 0 refills | Status: DC

## 2016-07-18 NOTE — ED Provider Notes (Signed)
[facility]-EMERGENCY DEPT Provider Note   CSN: [id number] Arrival date & time: 07/18/16  1216 By signing my name below, I, Levon Hedger, attest that this documentation has been prepared under the direction and in the presence of non-physician practitioner, Eyvonne Mechanic, PA-C. Electronically Signed: Levon Hedger, Scribe. 07/18/2016. 2:45 PM.   History   Chief Complaint Chief Complaint  Patient presents with  . Back Pain   HPI Andrew Galvan is a 36 y.o. male who presents to the Emergency Department complaining of sudden onset, moderate right lower back pain which began yesterday morning. Pt states he woke up and noticed pain to the area; no recent injury or heavy lifting. Per pt, pain is exacerbated by movement and inspiration; no alleviating factors noted. Pt notes associated swelling to the area last night and right lateral abdominal pain. Pt denies any nausea, vomiting, dysuria, difficulty urinating, fever, numbness, weakness, tingling, or any other associated symptoms.   The history is provided by the patient. No language interpreter was used.    No past medical history on file.  There are no active problems to display for this patient.  No past surgical history on file.   Home Medications    Prior to Admission medications   Medication Sig Start Date End Date Taking? Authorizing Provider  cephALEXin (KEFLEX) 500 MG capsule Take 1 capsule (500 mg total) by mouth 3 (three) times daily. Patient not taking: Reported on 07/18/2016 05/03/16   Rolland Porter, MD  cyclobenzaprine (FLEXERIL) 10 MG tablet Take 1 tablet (10 mg total) by mouth 2 (two) times daily as needed for muscle spasms. Patient not taking: Reported on 07/18/2016 06/09/15   Danelle Berry, PA-C  HYDROcodone-acetaminophen (NORCO/VICODIN) 5-325 MG tablet Take 2 tablets by mouth every 4 (four) hours as needed. Patient not taking: Reported on 07/18/2016 05/03/16   Rolland Porter, MD  naproxen (NAPROSYN) 500 MG tablet Take 1 tablet (500 mg total) by  mouth 2 (two) times daily. 07/18/16   Eyvonne Mechanic, PA-C  predniSONE (DELTASONE) 20 MG tablet Take 3 tablets (60 mg total) by mouth daily. Patient not taking: Reported on 07/18/2016 01/21/16   Emi Holes, PA-C  traMADol (ULTRAM) 50 MG tablet Take 1 tablet (50 mg total) by mouth every 6 (six) hours as needed. Patient not taking: Reported on 07/18/2016 06/09/15   Danelle Berry, PA-C    Family History Family History  Problem Relation Age of Onset  . Cancer Mother    Social History Social History  Substance Use Topics  . Smoking status: Never Smoker  . Smokeless tobacco: Never Used  . Alcohol use Yes     Comment: occ    Allergies   Dairy aid [lactase]   Review of Systems Review of Systems 10 systems reviewed and are negative for acute change except as noted in the HPI.  Physical Exam Updated Vital Signs BP (!) 136/93 (BP Location: Right Arm)   Pulse 65   Temp 98.8 F (37.1 C) (Oral)   Resp 16   Ht  (1.676 m)   Wt 72 kg   SpO2 99%   BMI 25.63 kg/m   Physical Exam  Constitutional: He is oriented to person, place, and time. He appears well-developed and well-nourished. No distress.  HENT:  Head: Normocephalic and atraumatic.  Eyes: Conjunctivae are normal.  Cardiovascular: Normal rate.   Pulmonary/Chest: Effort normal and breath sounds normal. He has no wheezes. He has no rales. He exhibits no tenderness.  Abdominal: He exhibits no distension.  TTP  of the RUQ and RLQ. No rebound or guarding.   Musculoskeletal:  TTP of the right lateral thoracic and upper lumbar musculature and flank.  Neurological: He is alert and oriented to person, place, and time.  Skin: Skin is warm and dry.  Psychiatric: He has a normal mood and affect.  Nursing note and vitals reviewed.   ED Treatments / Results  DIAGNOSTIC STUDIES:  Oxygen Saturation is 100% on RA, normal by my interpretation.    COORDINATION OF CARE:  2:17 PM Will order UA, lipase, CBC, and CT abdomen and pelvis  with contrast. Discussed treatment plan with pt at bedside and pt agreed to plan.   Labs (all labs ordered are listed, but only abnormal results are displayed) Labs Reviewed  COMPREHENSIVE METABOLIC PANEL - Abnormal; Notable for the following:       Result Value   Total Protein 8.2 (*)    Total Bilirubin 1.9 (*)    All other components within normal limits  CBC WITH DIFFERENTIAL/PLATELET  URINALYSIS, ROUTINE W REFLEX MICROSCOPIC  LIPASE, BLOOD    EKG  EKG Interpretation None      Radiology Ct Abdomen Pelvis W Contrast  Result Date: 07/18/2016 CLINICAL DATA:  Right lower back/abdominal pain EXAM: CT ABDOMEN AND PELVIS WITH CONTRAST TECHNIQUE: Multidetector CT imaging of the abdomen and pelvis was performed using the standard protocol following bolus administration of intravenous contrast. CONTRAST:  100 mL Isovue 300 IV COMPARISON:  None. FINDINGS: Lower chest: Lung bases are clear. Hepatobiliary: Liver is notable for focal fat/ altered perfusion along the falciform ligament, benign. Gallbladder is unremarkable. No intrahepatic or extrahepatic ductal dilatation. Pancreas: Within normal limits. Spleen: Within normal limits. Adrenals/Urinary Tract: Adrenal glands are within normal limits. 5 mm left lower pole renal cyst (series 2/image 30). Right kidney is within normal limits. Suspected excretory contrast in the left renal collecting system. No hydronephrosis. Bladder is within normal limits. Stomach/Bowel: Stomach is within normal limits. No evidence of bowel obstruction. Normal appendix (series 2/ image 53). Vascular/Lymphatic: No evidence of abdominal aortic aneurysm. No suspicious abdominopelvic lymphadenopathy. Reproductive: Prostate is unremarkable. Other: No abdominopelvic ascites. Musculoskeletal: Visualized osseous structures are within normal limits. IMPRESSION: Negative CT abdomen/ pelvis. Electronically Signed   By: Charline Bills M.D.   On: 07/18/2016 16:54     Procedures Procedures (including critical care time)  Medications Ordered in ED Medications  iopamidol (ISOVUE-300) 61 % injection (100 mLs Intravenous Contrast Given 07/18/16 1642)     Initial Impression / Assessment and Plan / ED Course  I have reviewed the triage vital signs and the nursing notes.  Pertinent labs & imaging results that were available during my care of the patient were reviewed by me and considered in my medical decision making (see chart for details).      Final Clinical Impressions(s) / ED Diagnoses   Final diagnoses:  Acute right-sided low back pain without sciatica    Labs: Urinalysis, CBC, CMP, lipase  Imaging: CT abdomen and pelvis with contrast  Consults:  Therapeutics:  Discharge Meds:   Assessment/Plan: 36 year old male presents today with complaints of back pain.  He has tenderness along the right lateral back.  Patient also noted to have right upper quadrant and right lower quadrant tenderness to palpation.  Patient is afebrile nontoxic in no acute distress.  Due to patient's abdominal discomfort imaging and labs were ordered.  His labs returned showing no significant abnormality, CT scan with no significant abnormality.  This is likely muscular pain.  Low  concern for acute intra-abdominal pathology.  Patient given symptomatic care instructions and strict return precautions.  Patient verbalized understanding and agreement to today's plan had no further questions or concerns at the time discharge     New Prescriptions New Prescriptions   NAPROXEN (NAPROSYN) 500 MG TABLET    Take 1 tablet (500 mg total) by mouth 2 (two) times daily.   I personally performed the services described in this documentation, which was scribed in my presence. The recorded information has been reviewed and is accurate.   Eyvonne Mechanic, PA-C 07/18/16 1706    Doug Sou, MD 07/19/16 [phone]

## 2016-07-18 NOTE — Discharge Instructions (Signed)
Please read attached information. If you experience any new or worsening signs or symptoms please return to the emergency room for evaluation. Please follow-up with your primary care provider or specialist as discussed. Please use medication prescribed only as directed and discontinue taking if you have any concerning signs or symptoms.   °

## 2016-07-18 NOTE — ED Triage Notes (Signed)
Pt c/o right lower back pain onset yesterday morning. Worse with movement or inspiration. No nausea, emesis, diarrhea. Ambulatory. No bowel or bladder changes.

## 2017-08-07 ENCOUNTER — Emergency Department (HOSPITAL_COMMUNITY)
Admission: EM | Admit: 2017-08-07 | Discharge: 2017-08-07 | Disposition: A | Discharge status: Home / Self Care | Payer: BLUE CROSS/BLUE SHIELD | Attending: Emergency Medicine | Admitting: Emergency Medicine

## 2017-08-07 ENCOUNTER — Encounter (HOSPITAL_COMMUNITY): Payer: Self-pay | Admitting: Emergency Medicine

## 2017-08-07 ENCOUNTER — Emergency Department (HOSPITAL_COMMUNITY): Payer: BLUE CROSS/BLUE SHIELD

## 2017-08-07 DIAGNOSIS — R1033 Periumbilical pain: Secondary | ICD-10-CM | POA: Diagnosis present

## 2017-08-07 DIAGNOSIS — J189 Pneumonia, unspecified organism: Secondary | ICD-10-CM | POA: Insufficient documentation

## 2017-08-07 DIAGNOSIS — J181 Lobar pneumonia, unspecified organism: Secondary | ICD-10-CM

## 2017-08-07 LAB — COMPREHENSIVE METABOLIC PANEL
ALK PHOS: 68 U/L (ref 38–126)
ALT: 38 U/L (ref 17–63)
AST: 29 U/L (ref 15–41)
Albumin: 4.6 g/dL (ref 3.5–5.0)
Anion gap: 9 (ref 5–15)
BILIRUBIN TOTAL: 1.7 mg/dL — AB (ref 0.3–1.2)
BUN: 8 mg/dL (ref 6–20)
CALCIUM: 9.5 mg/dL (ref 8.9–10.3)
CO2: 25 mmol/L (ref 22–32)
CREATININE: 1.25 mg/dL — AB (ref 0.61–1.24)
Chloride: 101 mmol/L (ref 101–111)
GFR calc Af Amer: >60 mL/min (ref >60)
GLUCOSE: 90 mg/dL (ref 65–99)
Potassium: 4 mmol/L (ref 3.5–5.1)
Sodium: 135 mmol/L (ref 135–145)
TOTAL PROTEIN: 8.6 g/dL — AB (ref 6.5–8.1)

## 2017-08-07 LAB — CBC
HCT: 48.5 % (ref 39.0–52.0)
Hemoglobin: 16.7 g/dL (ref 13.0–17.0)
MCH: 30.3 pg (ref 26.0–34.0)
MCHC: 34.4 g/dL (ref 30.0–36.0)
MCV: 87.9 fL (ref 78.0–100.0)
PLATELETS: 244 10*3/uL (ref 150–400)
RBC: 5.52 MIL/uL (ref 4.22–5.81)
RDW: 11.9 % (ref 11.5–15.5)
WBC: 6.5 10*3/uL (ref 4.0–10.5)

## 2017-08-07 LAB — URINALYSIS, ROUTINE W REFLEX MICROSCOPIC
BILIRUBIN URINE: NEGATIVE
Glucose, UA: NEGATIVE mg/dL
Hgb urine dipstick: NEGATIVE
Ketones, ur: NEGATIVE mg/dL
LEUKOCYTES UA: NEGATIVE
NITRITE: NEGATIVE
PROTEIN: NEGATIVE mg/dL
Specific Gravity, Urine: 1.023 (ref 1.005–1.030)
pH: 8 (ref 5.0–8.0)

## 2017-08-07 LAB — LIPASE, BLOOD: Lipase: 27 U/L (ref 11–51)

## 2017-08-07 MED ORDER — AMOXICILLIN 500 MG PO CAPS: 1000.0000 mg | ORAL_CAPSULE | Freq: Two times a day (BID) | ORAL | 0 refills | Status: AC

## 2017-08-07 MED ORDER — ONDANSETRON 4 MG PO TBDP: 4.0000 mg | ORAL_TABLET | Freq: Three times a day (TID) | ORAL | 0 refills | Status: AC | PRN

## 2017-08-07 MED ORDER — ACETAMINOPHEN 500 MG PO TABS
1000.0000 mg | ORAL_TABLET | Freq: Once | ORAL | Status: AC
  Administered 2017-08-07: 1000 mg via ORAL
  Filled 2017-08-07: qty 2

## 2017-08-07 MED ORDER — AZITHROMYCIN 250 MG PO TABS: 250.0000 mg | ORAL_TABLET | Freq: Every day | ORAL | 0 refills | Status: AC

## 2017-08-07 MED ORDER — IOHEXOL 300 MG/ML  SOLN
100.0000 mL | Freq: Once | INTRAMUSCULAR | Status: AC | PRN
  Administered 2017-08-07: 100 mL via INTRAVENOUS

## 2017-08-07 MED ORDER — SODIUM CHLORIDE 0.9 % IV BOLUS
1000.0000 mL | Freq: Once | INTRAVENOUS | Status: AC
  Administered 2017-08-07: 1000 mL via INTRAVENOUS

## 2017-08-07 NOTE — ED Provider Notes (Signed)
[facility]-EMERGENCY DEPT Provider Note   CSN: [id number] Arrival date & time: 08/07/17  0957     History   Chief Complaint Chief Complaint  Patient presents with  . Abdominal Pain  . Diarrhea    HPI Andrew Galvan is a 37 y.o. male.  The history is provided by the patient and medical records. No language interpreter was used.  Abdominal Pain   Associated symptoms include fever, diarrhea and vomiting. Pertinent negatives include nausea and constipation.  Diarrhea   Associated symptoms include abdominal pain and vomiting.    Andrew Galvan is an otherwise healthy 37 y.o. male who presents to the Emergency Department complaining of right low back pain for about a month.  Yesterday, symptoms worsened and he also had pain to periumbilical and right lower abdominal region.  Denies any nausea.  Earlier today he had one loose nonbloody stool and did have one episode of emesis.  He feels as if his symptoms have worsened today.  No medications taken prior to arrival for symptoms.  No alleviating or aggravating factors noted.  Denies any urinary symptoms.  No known fever at home, however did spike temperature to 100.1 while in emergency department. No known sick contacts. No known injury or trauma.    History reviewed. No pertinent past medical history.  There are no active problems to display for this patient.   History reviewed. No pertinent surgical history.      Home Medications    Prior to Admission medications   Medication Sig Start Date End Date Taking? Authorizing Provider  Pseudoephedrine-APAP-DM (DAYQUIL PO) Take 1 capsule by mouth daily as needed (cough).   Yes [provider]  amoxicillin (AMOXIL) 500 MG capsule Take 2 capsules (1,000 mg total) by mouth 2 (two) times daily. 08/07/17   Rayneisha Bouza, Chase Picket, PA-C  azithromycin (ZITHROMAX) 250 MG tablet Take 1 tablet (250 mg total) by mouth daily. Take first 2 tablets together, then 1 every day until  finished. 08/07/17   Shion Bluestein, Chase Picket, PA-C  cephALEXin (KEFLEX) 500 MG capsule Take 1 capsule (500 mg total) by mouth 3 (three) times daily. Patient not taking: Reported on 07/18/2016 05/03/16   Rolland Porter, MD  cyclobenzaprine (FLEXERIL) 10 MG tablet Take 1 tablet (10 mg total) by mouth 2 (two) times daily as needed for muscle spasms. Patient not taking: Reported on 07/18/2016 06/09/15   Danelle Berry, PA-C  HYDROcodone-acetaminophen (NORCO/VICODIN) 5-325 MG tablet Take 2 tablets by mouth every 4 (four) hours as needed. Patient not taking: Reported on 07/18/2016 05/03/16   Rolland Porter, MD  naproxen (NAPROSYN) 500 MG tablet Take 1 tablet (500 mg total) by mouth 2 (two) times daily. Patient not taking: Reported on 08/07/2017 07/18/16   Hedges, Tinnie Gens, PA-C  ondansetron (ZOFRAN ODT) 4 MG disintegrating tablet Take 1 tablet (4 mg total) by mouth every 8 (eight) hours as needed for nausea or vomiting. 08/07/17   Demia Viera, Chase Picket, PA-C  predniSONE (DELTASONE) 20 MG tablet Take 3 tablets (60 mg total) by mouth daily. Patient not taking: Reported on 07/18/2016 01/21/16   Emi Holes, PA-C  traMADol (ULTRAM) 50 MG tablet Take 1 tablet (50 mg total) by mouth every 6 (six) hours as needed. Patient not taking: Reported on 07/18/2016 06/09/15   Danelle Berry, PA-C    Family History Family History  Problem Relation Age of Onset  . Cancer Mother     Social History Social History   Tobacco Use  . Smoking status:  Never Smoker  . Smokeless tobacco: Never Used  Substance Use Topics  . Alcohol use: Yes    Comment: occ  . Drug use: No     Allergies   Dairy aid [lactase]   Review of Systems Review of Systems  Constitutional: Positive for fever.  Gastrointestinal: Positive for abdominal pain, diarrhea and vomiting. Negative for blood in stool, constipation and nausea.  Musculoskeletal: Positive for back pain.  All other systems reviewed and are negative.    Physical Exam Updated Vital Signs BP  (!) 154/99 (BP Location: Right Arm)   Pulse 98   Temp 100.1 F (37.8 C) (Oral)   Resp (!) 24   Ht  (1.651 m)   Wt 72.6 kg (160 lb)   SpO2 96%   BMI 26.63 kg/m   Physical Exam  Constitutional: He is oriented to person, place, and time. He appears well-developed and well-nourished. No distress.  HENT:  Head: Normocephalic and atraumatic.  Cardiovascular: Normal rate, regular rhythm and normal heart sounds.  No murmur heard. Pulmonary/Chest: Effort normal and breath sounds normal. No respiratory distress.  Abdominal: Soft. Bowel sounds are normal. He exhibits no distension.  Mild periumbilical and RLQ tenderness. No rebound, no guarding.   Musculoskeletal:  No CVA tenderness. No T/L spine tenderness. Mild tenderness to the right flank.   Neurological: He is alert and oriented to person, place, and time.  Skin: Skin is warm and dry.  Nursing note and vitals reviewed.    ED Treatments / Results  Labs (all labs ordered are listed, but only abnormal results are displayed) Labs Reviewed  COMPREHENSIVE METABOLIC PANEL - Abnormal; Notable for the following components:      Result Value   Creatinine, Ser 1.25 (*)    Total Protein 8.6 (*)    Total Bilirubin 1.7 (*)    All other components within normal limits  LIPASE, BLOOD  CBC  URINALYSIS, ROUTINE W REFLEX MICROSCOPIC    EKG None  Radiology Ct Abdomen Pelvis W Contrast  Result Date: 08/07/2017 CLINICAL DATA:  Right lower quadrant pain and diarrhea for 1 day. EXAM: CT ABDOMEN AND PELVIS WITH CONTRAST TECHNIQUE: Multidetector CT imaging of the abdomen and pelvis was performed using the standard protocol following bolus administration of intravenous contrast. CONTRAST:  OMNIPAQUE IOHEXOL 300 MG/ML  SOLN COMPARISON:  07/18/2016 FINDINGS: Lower Chest: Mild airspace disease is seen in the medial right lower lobe, which is new since previous study and suspicious for pneumonia. Hepatobiliary: No hepatic masses identified.  Gallbladder is unremarkable. Pancreas:  No mass or inflammatory changes. Spleen: Within normal limits in size and appearance. Adrenals/Urinary Tract: No masses identified. Probable tiny sub-cm cyst in lower pole of left kidney is stable. No evidence of hydronephrosis. Unremarkable unopacified urinary bladder. Stomach/Bowel: No evidence of obstruction, inflammatory process or abnormal fluid collections. Vascular/Lymphatic: No pathologically enlarged lymph nodes. No abdominal aortic aneurysm. Reproductive:  No mass or other significant abnormality. Other:  None. Musculoskeletal:  No suspicious bone lesions identified. IMPRESSION: No acute findings or significant abnormality within the abdomen or pelvis. Mild infiltrate in the medial right lower lobe, suspicious for pneumonia. Electronically Signed   By: Myles Rosenthal M.D.   On: 08/07/2017 16:01    Procedures Procedures (including critical care time)  Medications Ordered in ED Medications  acetaminophen (TYLENOL) tablet 1,000 mg (1,000 mg Oral Given 08/07/17 1520)  sodium chloride 0.9 % bolus 1,000 mL (1,000 mLs Intravenous New Bag/Given 08/07/17 1520)  iohexol (OMNIPAQUE) 300 MG/ML solution 100 mL (  100 mLs Intravenous Contrast Given 08/07/17 1528)     Initial Impression / Assessment and Plan / ED Course  I have reviewed the triage vital signs and the nursing notes.  Pertinent labs & imaging results that were available during my care of the patient were reviewed by me and considered in my medical decision making (see chart for details).    Andrew Galvan is a 37 y.o. male who presents to ED for right low to mid back pain for a couple of days as well as periumbilical pain. Temp of 100.1. Tenderness to mid-to-low right side of the back as well as periumbilically. Given fever and location of pain, will obtain CT scan to r/o appy. Kidney stone possible as well. Labs reviewed: Bump in creatinine at 1.25. Normal white count. IV hydration given. CT scan reviewed -  no abdominal pathology, however he does have a mild infiltrate in the medial right lower lobe suspicious for pna. This does fit clinically with fever and vague right lower back pain. Will treat with azithro / amoxil. He is oxygenating well and otherwise healthy. Feel he is appropriate for outpatient treatment, however given strict return precautions. All questions answered.    Final Clinical Impressions(s) / ED Diagnoses   Final diagnoses:  Community acquired pneumonia of right lower lobe of lung [facility])    ED Discharge Orders        Ordered    azithromycin (ZITHROMAX) 250 MG tablet  Daily     08/07/17 1639    amoxicillin (AMOXIL) 500 MG capsule  2 times daily     08/07/17 1639    ondansetron (ZOFRAN ODT) 4 MG disintegrating tablet  Every 8 hours PRN     08/07/17 1640       Joanna Borawski, Chase Picket, PA-C 08/07/17 1648    Rolland Porter, MD 08/13/17 [phone]

## 2017-08-07 NOTE — Discharge Instructions (Addendum)
Please take all of your antibiotics until finished! Zofran as needed for nausea.  Alternate between Tylenol and Motrin every 4 hours as needed to control your fever.  Follow up with your doctor in regards to your hospital visit - I have a reference guide for doctors in the area below. Return to the emergency department if symptoms worsen, become progressive, or become more concerning.  Pneumonia, Adult Pneumonia is an infection of the lungs.   CAUSES Pneumonia may be caused by bacteria or a virus. Usually, these infections are caused by breathing infectious particles into the lungs (respiratory tract).  SYMPTOMS  Cough.  Fever.  Chest pain.  Increased rate of breathing.  Wheezing.  Mucus production.   DIAGNOSIS  If you have the common symptoms of pneumonia, your caregiver will typically confirm the diagnosis with a chest X-ray. The X-ray will show an abnormality in the lung (pulmonary infiltrate) if you have pneumonia. Other tests of your blood, urine, or sputum may be done to find the specific cause of your pneumonia. Your caregiver may also do tests (blood gases or pulse oximetry) to see how well your lungs are working.  TREATMENT  Some forms of pneumonia may be spread to other people when you cough or sneeze. You may be asked to wear a mask before and during your exam. Pneumonia that is caused by bacteria is treated with antibiotic medicine. Pneumonia that is caused by the influenza virus may be treated with an antiviral medicine. Most other viral infections must run their course. These infections will not respond to antibiotics.  HOME CARE INSTRUCTIONS  Cough suppressants may be used if you are losing too much rest. However, coughing protects you by clearing your lungs. You should avoid using cough suppressants if you can.  Your healthcare provider may have prescribed medicine if he or she thinks your pneumonia is caused by a bacteria or influenza. Finish your medicine even if you start  to feel better.  Do not smoke. Smoking is a common cause of bronchitis and can contribute to pneumonia. If you are a smoker and continue to smoke, your cough may last several weeks after your pneumonia has cleared.  A cold steam vaporizer or humidifier in your room or home may help loosen mucus.  Coughing is often worse at night. Sleeping in a semi-upright position in a recliner or using a couple pillows under your head will help with this.  Get rest as you feel it is needed. Your body will usually let you know when you need to rest.   SEEK IMMEDIATE MEDICAL CARE IF:  Your illness becomes worse. This is especially true if you are elderly or weakened from any other disease.  You cannot control your cough with suppressants and are losing sleep.  You begin coughing up blood.  You develop pain which is getting worse or is uncontrolled with medicines.  You have a fever.  Any of the symptoms which initially brought you in for treatment are getting worse rather than better.  You develop shortness of breath or chest pain.   MAKE SURE YOU:  Understand these instructions.  Will watch your condition.  Will get help right away if you are not doing well or get worse.     To find a primary care or specialty doctor please call [phone] or [phone] to access "[facility] Find a Doctor Service."  You may also go on the [facility] website at [email or web address]  There are also multiple [facility] [place] and [employer]  practices throughout the [place] that are frequently accepting new patients. You may find a clinic that is close to your home and contact them.  [facility] - [address] [place] [address] [phone]  [facility] in [place] (also locations in [place] and [place]) - [address] [employer] [place] [phone]  [facility] Department - [address] [place] [phone]

## 2017-08-07 NOTE — ED Triage Notes (Signed)
Reports RLQ pains that started last night and diarrhea started this am. No issues with urination.

## 2017-08-07 NOTE — ED Notes (Signed)
Bed: WA21 Expected date:  Expected time:  Means of arrival:  Comments: 

## 2017-09-12 ENCOUNTER — Emergency Department (HOSPITAL_COMMUNITY)
Admission: EM | Admit: 2017-09-12 | Discharge: 2017-09-12 | Disposition: A | Discharge status: Home / Self Care | Payer: BLUE CROSS/BLUE SHIELD | Attending: Emergency Medicine | Admitting: Emergency Medicine

## 2017-09-12 ENCOUNTER — Encounter (HOSPITAL_COMMUNITY): Payer: Self-pay | Admitting: Emergency Medicine

## 2017-09-12 ENCOUNTER — Emergency Department (HOSPITAL_COMMUNITY): Payer: BLUE CROSS/BLUE SHIELD

## 2017-09-12 DIAGNOSIS — Z79899 Other long term (current) drug therapy: Secondary | ICD-10-CM | POA: Diagnosis not present

## 2017-09-12 DIAGNOSIS — M79602 Pain in left arm: Secondary | ICD-10-CM | POA: Diagnosis present

## 2017-09-12 DIAGNOSIS — W19XXXA Unspecified fall, initial encounter: Secondary | ICD-10-CM | POA: Insufficient documentation

## 2017-09-12 MED ORDER — CYCLOBENZAPRINE HCL 10 MG PO TABS: 10.0000 mg | ORAL_TABLET | Freq: Two times a day (BID) | ORAL | 0 refills | Status: AC | PRN

## 2017-09-12 MED ORDER — IBUPROFEN 800 MG PO TABS
800.0000 mg | ORAL_TABLET | Freq: Once | ORAL | Status: AC
  Administered 2017-09-12: 800 mg via ORAL
  Filled 2017-09-12: qty 1

## 2017-09-12 MED ORDER — IBUPROFEN 800 MG PO TABS: 800.0000 mg | ORAL_TABLET | Freq: Three times a day (TID) | ORAL | 0 refills | Status: DC

## 2017-09-12 NOTE — ED Provider Notes (Signed)
[facility]-EMERGENCY DEPT Provider Note   CSN: [id number] Arrival date & time: 09/12/17  1234     History   Chief Complaint Chief Complaint  Patient presents with  . Fall  . Arm Pain    HPI Andrew Galvan is a 37 y.o. male who presents to the emergency department with a chief complaint of fall.  The patient reports a mechanical fall onto concrete with his left arm outstretched earlier today to try and catch himself.  He denies hitting his head, LOC, nausea, or emesis.  He reports left arm pain above and below the elbow.  Pain is constant, but worse with movement of the left arm and improved with holding the arm still.  He denies numbness or weakness.  No history of left arm surgery or injury.  No treatment prior to arrival.  The history is provided by the patient. No language interpreter was used.    History reviewed. No pertinent past medical history.  There are no active problems to display for this patient.   History reviewed. No pertinent surgical history.      Home Medications    Prior to Admission medications   Medication Sig Start Date End Date Taking? Authorizing Provider  amoxicillin (AMOXIL) 500 MG capsule Take 2 capsules (1,000 mg total) by mouth 2 (two) times daily. 08/07/17   Ward, Chase Picket, PA-C  azithromycin (ZITHROMAX) 250 MG tablet Take 1 tablet (250 mg total) by mouth daily. Take first 2 tablets together, then 1 every day until finished. 08/07/17   Ward, Chase Picket, PA-C  cyclobenzaprine (FLEXERIL) 10 MG tablet Take 1 tablet (10 mg total) by mouth 2 (two) times daily as needed for muscle spasms. 09/12/17   Reagen Haberman A, PA-C  ibuprofen (ADVIL,MOTRIN) 800 MG tablet Take 1 tablet (800 mg total) by mouth 3 (three) times daily. 09/12/17   Areebah Meinders A, PA-C  ondansetron (ZOFRAN ODT) 4 MG disintegrating tablet Take 1 tablet (4 mg total) by mouth every 8 (eight) hours as needed for nausea or vomiting. 08/07/17   Ward, Chase Picket, PA-C    predniSONE (DELTASONE) 20 MG tablet Take 3 tablets (60 mg total) by mouth daily. Patient not taking: Reported on 07/18/2016 01/21/16   Emi Holes, PA-C  Pseudoephedrine-APAP-DM (DAYQUIL PO) Take 1 capsule by mouth daily as needed (cough).    [provider]  traMADol (ULTRAM) 50 MG tablet Take 1 tablet (50 mg total) by mouth every 6 (six) hours as needed. Patient not taking: Reported on 07/18/2016 06/09/15   Danelle Berry, PA-C    Family History Family History  Problem Relation Age of Onset  . Cancer Mother     Social History Social History   Tobacco Use  . Smoking status: Never Smoker  . Smokeless tobacco: Never Used  Substance Use Topics  . Alcohol use: Yes    Comment: occ  . Drug use: No     Allergies   Dairy aid [lactase]   Review of Systems Review of Systems  Constitutional: Negative for activity change, chills and fever.  Respiratory: Negative for shortness of breath.   Cardiovascular: Negative for chest pain.  Gastrointestinal: Negative for abdominal pain.  Musculoskeletal: Positive for arthralgias and myalgias. Negative for back pain, gait problem, joint swelling, neck pain and neck stiffness.  Skin: Negative for color change, rash and wound.  Neurological: Negative for weakness and numbness.     Physical Exam Updated Vital Signs BP (!) 155/99 (BP Location: Right Arm)  Pulse 74   Temp 98.1 F (36.7 C) (Oral)   Resp 20   Ht 5\' 6"  (1.676 m)   Wt 72.6 kg (160 lb)   SpO2 100%   BMI 25.82 kg/m   Physical Exam  Constitutional: He appears well-developed.  HENT:  Head: Normocephalic.  Eyes: Conjunctivae are normal.  Neck: Neck supple.  Cardiovascular: Normal rate and regular rhythm.  No murmur heard. Pulmonary/Chest: Effort normal.  Abdominal: Soft. He exhibits no distension.  Musculoskeletal: Normal range of motion. He exhibits tenderness. He exhibits no edema or deformity.  Diffusely tender to palpation to the proximal forearm and distal  upper arm.  No point tenderness.  Full active and passive range of motion of the left wrist, elbow, and shoulder.  No pain with supination or pronation.  No focal tenderness to the left wrist, elbow, or shoulder.  Radial pulses are 2+ and symmetric.  Sensation is intact and equal throughout.  5 out of 5 strength against resistance.  No overlying edema, crepitus, step-offs, obvious deformities, or contusions.  Neurological: He is alert.  Skin: Skin is warm and dry.  Psychiatric: His behavior is normal.  Nursing note and vitals reviewed.    ED Treatments / Results  Labs (all labs ordered are listed, but only abnormal results are displayed) Labs Reviewed - No data to display  EKG None  Radiology Dg Elbow Complete Left  Result Date: 09/12/2017 CLINICAL DATA:  Initial evaluation for acute trauma, fall. EXAM: LEFT ELBOW - COMPLETE 3+ VIEW COMPARISON:  None. FINDINGS: There is no evidence of fracture, dislocation, or joint effusion. There is no evidence of arthropathy or other focal bone abnormality. Soft tissues are unremarkable. IMPRESSION: No acute osseous abnormality about the left elbow. Electronically Signed   By: Rise MuBenjamin  McClintock M.D.   On: 09/12/2017 13:12   Dg Humerus Left  Result Date: 09/12/2017 CLINICAL DATA:  Fall EXAM: LEFT HUMERUS - 2+ VIEW COMPARISON:  Elbow series performed today FINDINGS: There is no evidence of fracture or other focal bone lesions. Soft tissues are unremarkable. IMPRESSION: Negative. Electronically Signed   By: Charlett NoseKevin  Dover M.D.   On: 09/12/2017 13:12    Procedures Procedures (including critical care time)  Medications Ordered in ED Medications  ibuprofen (ADVIL,MOTRIN) tablet 800 mg (800 mg Oral Given 09/12/17 1441)     Initial Impression / Assessment and Plan / ED Course  I have reviewed the triage vital signs and the nursing notes.  Pertinent labs & imaging results that were available during my care of the patient were reviewed by me and  considered in my medical decision making (see chart for details).      37 year old male with no pertinent past medical history presenting with a chief complaint of fall.  He endorses left arm pain after he fell on his left outstretched arm.  No focal findings on left upper extremity exam.  Patient X-Ray negative for obvious fracture or dislocation. Pain managed in ED. Pt advised to follow up with orthopedics if symptoms persist for possibility of missed fracture diagnosis. Patient given sling for comfort while in ED, conservative therapy recommended and discussed. Patient will be dc home & is agreeable with above plan.  Final Clinical Impressions(s) / ED Diagnoses   Final diagnoses:  Left arm pain    ED Discharge Orders        Ordered    cyclobenzaprine (FLEXERIL) 10 MG tablet  2 times daily PRN     09/12/17 1439    ibuprofen (ADVIL,MOTRIN)  800 MG tablet  3 times daily     09/12/17 1439       Misaki Sozio, Coral Else, PA-C 09/12/17 1522    Terrilee Files, MD 09/13/17 [phone]

## 2017-09-12 NOTE — Discharge Instructions (Signed)
Thank you for allowing me to provide your care today in the emergency department.  Your x-rays were negative for any fractures/broken bones.   Please take 800 mg of ibuprofen with food or 1000 mg of Tylenol every 8 hours for pain control.  You can also alternate between these 2 medications and take a dose every 4 hours.  Apply ice for 15 to 20 minutes up to 3-4 times a day to help with pain and swelling.  I would avoid putting heat on any areas that are sore for at least 48 hours because this can make swelling and pain worse in the beginning.  Take 1 tablet of Flexeril by mouth up to 2 times daily for muscle pain or spasms.  Please do not drive or work while using this medication because it can make you drowsy.  Wear the sling only as needed for the first few days for comfort.  As your pain improves, make sure to start moving your arm so that your muscles and joints do not get stiff.  Your symptoms should slowly start to improve over the next few days.  If your symptoms have not improved at all in 1 week, please follow-up with Orthopaedics for a re-check.  Return to the emergency department if you have another fall or injury, develop numbness, weakness, or other new concerning symptoms.

## 2017-09-12 NOTE — ED Triage Notes (Signed)
Pt reports he fell this morning and tried catching himself with left arm and has pain in upper arm and elbow area,

## 2020-02-26 IMAGING — CT CT ABD-PELV W/ CM
2 of 4 series · 16 of 46 positions shown, 18 images · IV contrast (omnipaque)
Comparison: 07/18/2016

CLINICAL DATA: Right lower quadrant pain and diarrhea for 1 day.

EXAM:
CT ABDOMEN AND PELVIS WITH CONTRAST
TECHNIQUE: Multidetector CT imaging of the abdomen and pelvis was performed
using the standard protocol following bolus administration of
intravenous contrast.
CONTRAST:  100mL OMNIPAQUE IOHEXOL 300 MG/ML  SOLN

[Series 2: axial st · axial · 0.71mm/px · z∈[+1011,+1406]mm · 13 of 91 slices shown, 15 images]
[im 6/91  soft-tissue]
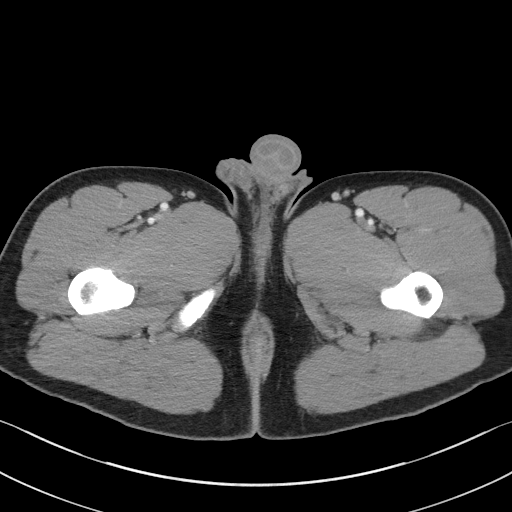
[im 6/91  bone]
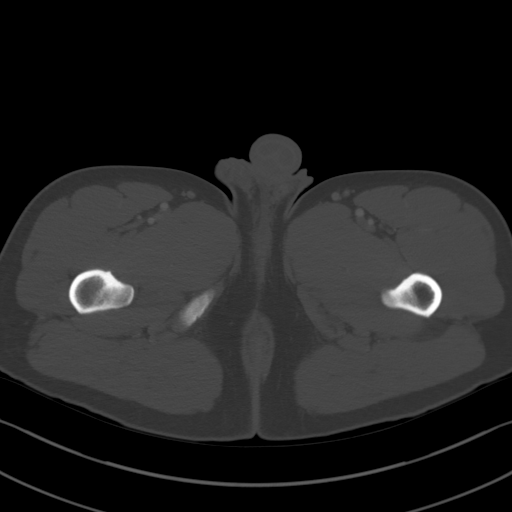
[im 12/91  soft-tissue]
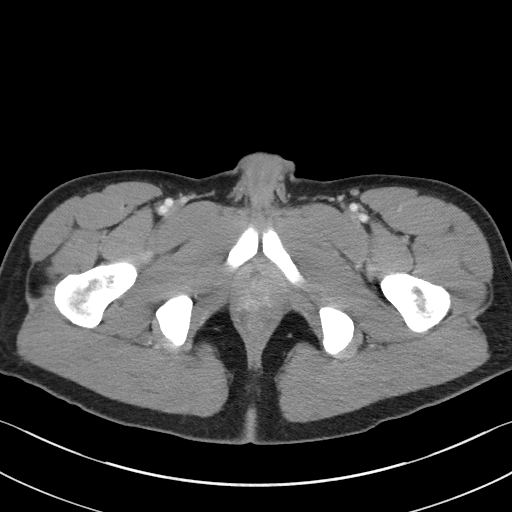
[im 17/91  soft-tissue]
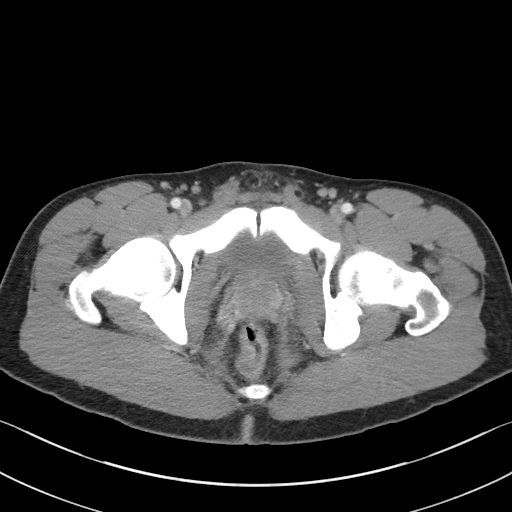
[im 29/91  soft-tissue]
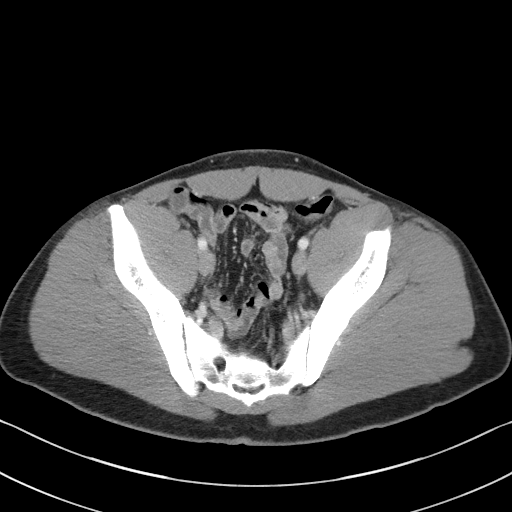
[im 34/91  soft-tissue]
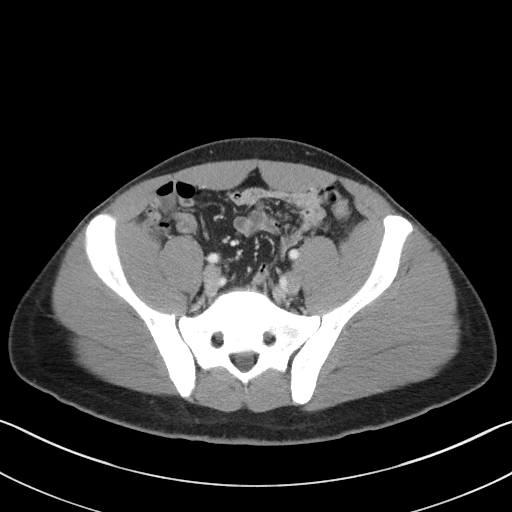
[im 40/91  soft-tissue]
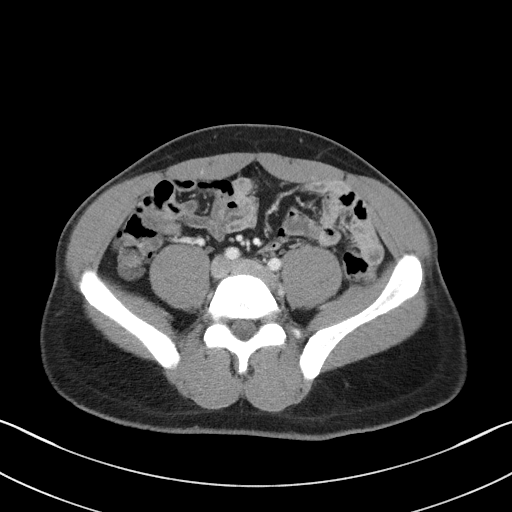
[im 46/91  soft-tissue]
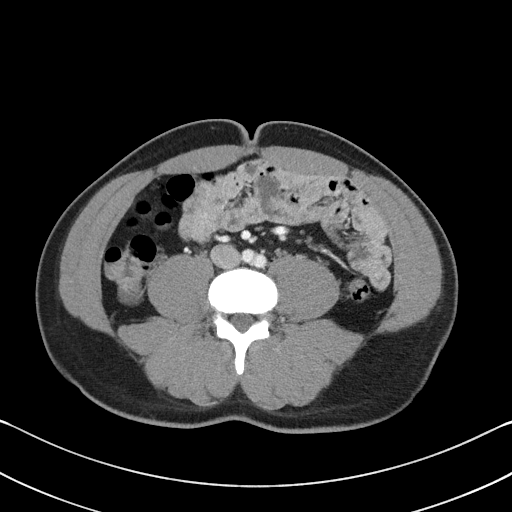
[im 51/91  soft-tissue]
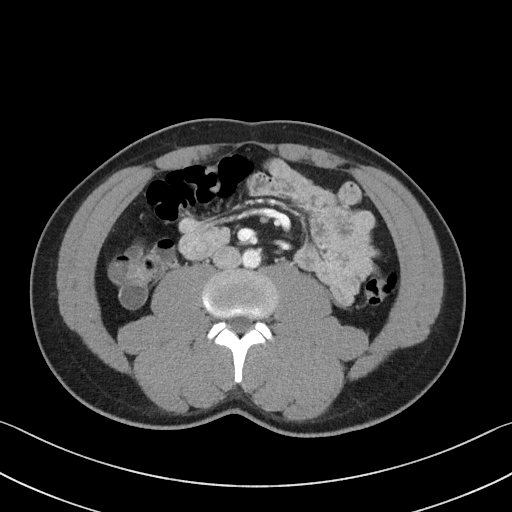
[im 57/91  soft-tissue]
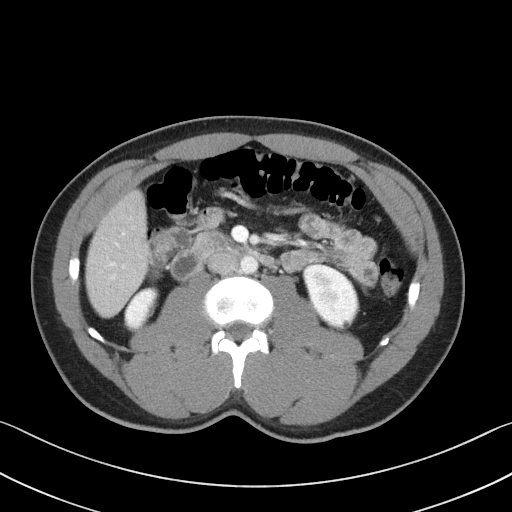
[im 57/91  bone]
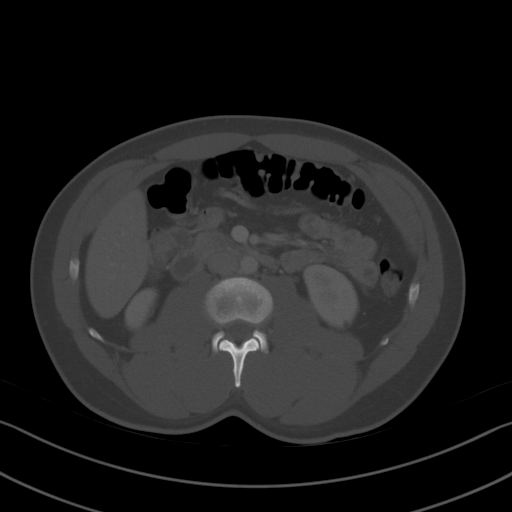
[im 62/91  soft-tissue]
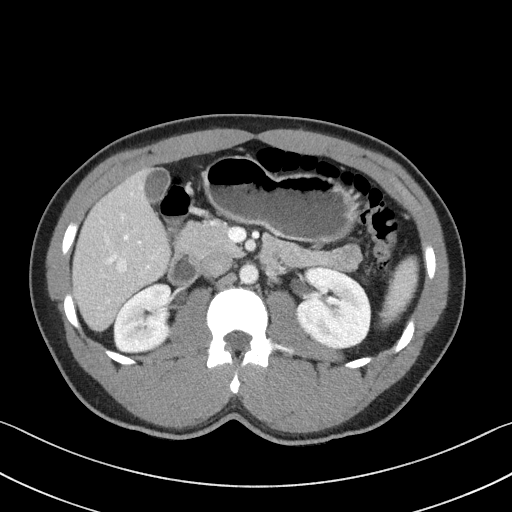
[im 74/91  soft-tissue]
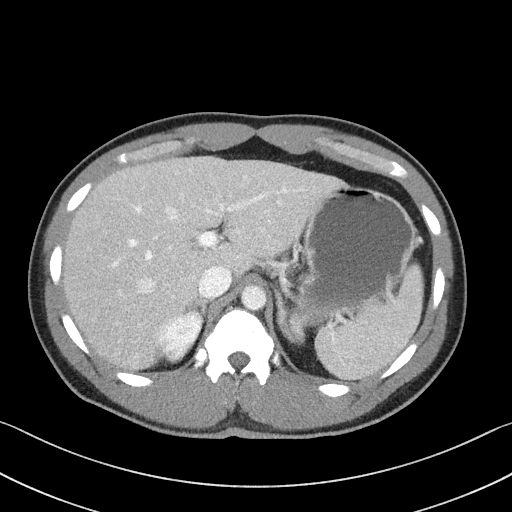
[im 79/91  soft-tissue]
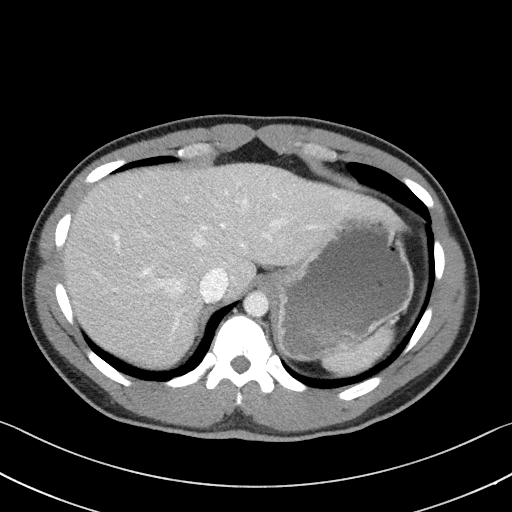
[im 85/91  soft-tissue]
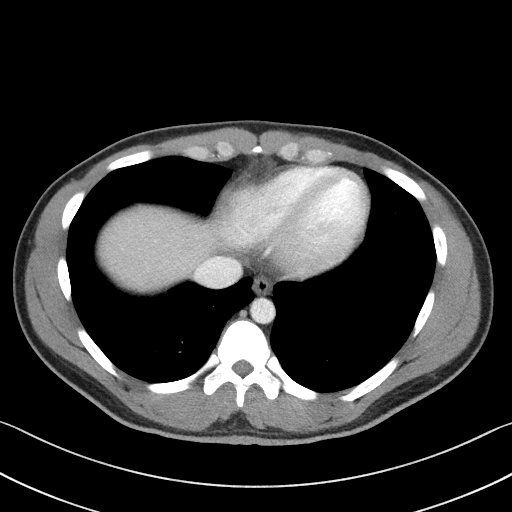

[Series 4: coronal st · coronal · 0.69mm/px · 3 of 101 slices shown]
[im 34/101  soft-tissue]
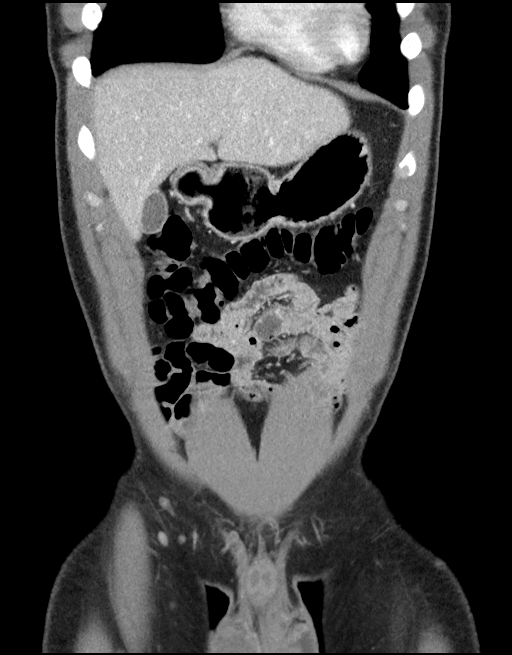
[im 45/101  soft-tissue]
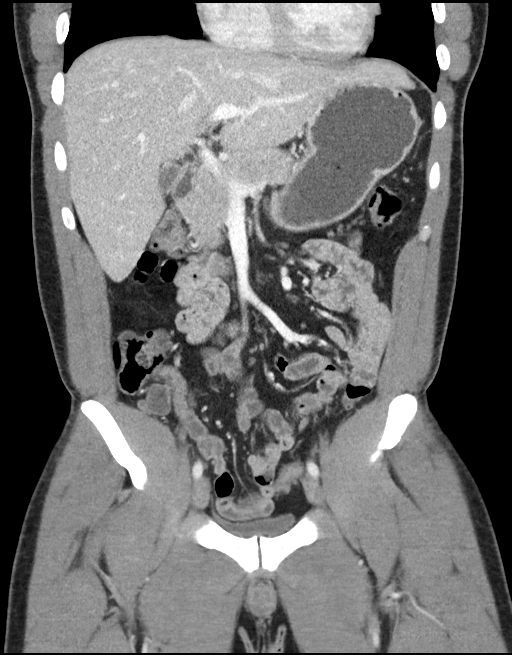
[im 56/101  soft-tissue]
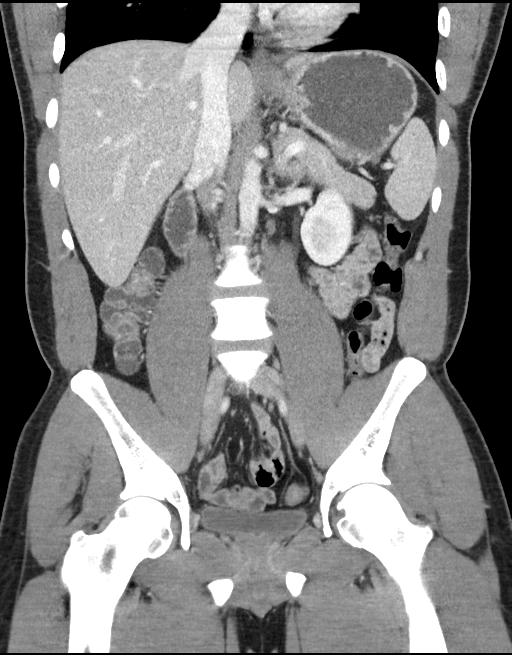

[16 of 46 positions shown; findings below may reference images not displayed]

FINDINGS: Lower Chest: Mild airspace disease is seen in the medial right lower
lobe, which is new since previous study and suspicious for
pneumonia.

Hepatobiliary: No hepatic masses identified. Gallbladder is
unremarkable.

Pancreas:  No mass or inflammatory changes.

Spleen: Within normal limits in size and appearance.

Adrenals/Urinary Tract: No masses identified. Probable tiny sub-cm
cyst in lower pole of left kidney is stable. No evidence of
hydronephrosis. Unremarkable unopacified urinary bladder.

Stomach/Bowel: No evidence of obstruction, inflammatory process or
abnormal fluid collections.

Vascular/Lymphatic: No pathologically enlarged lymph nodes. No
abdominal aortic aneurysm.

Reproductive:  No mass or other significant abnormality.

Other:  None.

Musculoskeletal:  No suspicious bone lesions identified.
IMPRESSION: No acute findings or significant abnormality within the abdomen or
pelvis.

Mild infiltrate in the medial right lower lobe, suspicious for
pneumonia.

## 2021-03-01 ENCOUNTER — Emergency Department (HOSPITAL_COMMUNITY)
Admission: EM | Admit: 2021-03-01 | Discharge: 2021-03-01 | Disposition: A | Discharge status: Home / Self Care | Payer: BC Managed Care – PPO | Attending: Emergency Medicine | Admitting: Emergency Medicine

## 2021-03-01 ENCOUNTER — Encounter (HOSPITAL_COMMUNITY): Payer: Self-pay | Admitting: Emergency Medicine

## 2021-03-01 ENCOUNTER — Other Ambulatory Visit: Payer: Self-pay

## 2021-03-01 DIAGNOSIS — M25511 Pain in right shoulder: Secondary | ICD-10-CM | POA: Diagnosis not present

## 2021-03-01 DIAGNOSIS — M549 Dorsalgia, unspecified: Secondary | ICD-10-CM

## 2021-03-01 DIAGNOSIS — M546 Pain in thoracic spine: Secondary | ICD-10-CM | POA: Insufficient documentation

## 2021-03-01 MED ORDER — ACETAMINOPHEN 325 MG PO TABS
650.0000 mg | ORAL_TABLET | Freq: Once | ORAL | Status: AC
  Administered 2021-03-01: 650 mg via ORAL
  Filled 2021-03-01: qty 2

## 2021-03-01 MED ORDER — KETOROLAC TROMETHAMINE 15 MG/ML IJ SOLN
15.0000 mg | Freq: Once | INTRAMUSCULAR | Status: AC
  Administered 2021-03-01: 15 mg via INTRAMUSCULAR
  Filled 2021-03-01: qty 1

## 2021-03-01 MED ORDER — METHOCARBAMOL 500 MG PO TABS: 500.0000 mg | ORAL_TABLET | Freq: Two times a day (BID) | ORAL | 0 refills | Status: AC

## 2021-03-01 MED ORDER — DICLOFENAC SODIUM 1 % EX GEL: 2.0000 g | Freq: Four times a day (QID) | CUTANEOUS | 0 refills | Status: AC

## 2021-03-01 MED ORDER — CELECOXIB 50 MG PO CAPS: 50.0000 mg | ORAL_CAPSULE | Freq: Two times a day (BID) | ORAL | 0 refills | Status: AC

## 2021-03-01 NOTE — ED Provider Notes (Signed)
[facility]-EMERGENCY DEPT Provider Note   CSN: [id number] Arrival date & time: 03/01/21  7544     History Chief Complaint  Patient presents with   Back Pain    Andrew Galvan is a 40 y.o. male with no significant past medical history who presents with 1 to 2 weeks of back pain that is acutely worsened over the last 3 days.  Patient reports pain on the right side of his upper back, and right shoulder.  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  Patient has tried over-the-counter icy hot patches, as well as ibuprofen, but reports that he discontinued taking ibuprofen due to some stomach upset.  Patient has not tried Tylenol or other pain medications.  Patient reports pain is 10/10 at this time.  Patient has not taken anything for pain today.  Patient denies history of chronic corticosteroid use, history of cancer, IV drug use, recent fever.  Patient denies any numbness or tingling in any of his extremities, numbness or tingling in the groin, difficulty with defecation or urination.  Back Pain     History reviewed. No pertinent past medical history.  There are no problems to display for this patient.   History reviewed. No pertinent surgical history.     Family History  Problem Relation Age of Onset   Cancer Mother     Social History   Tobacco Use   Smoking status: Never   Smokeless tobacco: Never  Vaping Use   Vaping Use: Every day  Substance Use Topics   Alcohol use: Yes    Comment: occ   Drug use: Yes    Types: Marijuana    Home Medications Prior to Admission medications   Medication Sig Start Date End Date Taking? Authorizing Provider  celecoxib (CELEBREX) 50 MG capsule Take 1 capsule (50 mg total) by mouth 2 (two) times daily. 03/01/21  Yes Nykeria Mealing H, PA-C  diclofenac Sodium (VOLTAREN) 1 % GEL Apply 2 g topically 4 (four) times daily. 03/01/21  Yes Leonel Mccollum H, PA-C  methocarbamol  (ROBAXIN) 500 MG tablet Take 1 tablet (500 mg total) by mouth 2 (two) times daily. 03/01/21  Yes Donnella Morford H, PA-C  amoxicillin (AMOXIL) 500 MG capsule Take 2 capsules (1,000 mg total) by mouth 2 (two) times daily. 08/07/17   Ward, Chase Picket, PA-C  azithromycin (ZITHROMAX) 250 MG tablet Take 1 tablet (250 mg total) by mouth daily. Take first 2 tablets together, then 1 every day until finished. 08/07/17   Ward, Chase Picket, PA-C  cyclobenzaprine (FLEXERIL) 10 MG tablet Take 1 tablet (10 mg total) by mouth 2 (two) times daily as needed for muscle spasms. 09/12/17   McDonald, Mia A, PA-C  ondansetron (ZOFRAN ODT) 4 MG disintegrating tablet Take 1 tablet (4 mg total) by mouth every 8 (eight) hours as needed for nausea or vomiting. 08/07/17   Ward, Chase Picket, PA-C  predniSONE (DELTASONE) 20 MG tablet Take 3 tablets (60 mg total) by mouth daily. Patient not taking: Reported on 07/18/2016 01/21/16   Emi Holes, PA-C  Pseudoephedrine-APAP-DM (DAYQUIL PO) Take 1 capsule by mouth daily as needed (cough).    [provider]  traMADol (ULTRAM) 50 MG tablet Take 1 tablet (50 mg total) by mouth every 6 (six) hours as needed. Patient not taking: Reported on 07/18/2016 06/09/15   Danelle Berry, PA-C    Allergies    Dairy aid [tilactase]  Review of Systems  Review of Systems  Musculoskeletal:  Positive for back pain.  All other systems reviewed and are negative.  Physical Exam Updated Vital Signs BP (!) 150/99 (BP Location: Left Arm)   Pulse 71   Temp 98 F (36.7 C) (Oral)   Resp 16   SpO2 97%   Physical Exam Vitals and nursing note reviewed.  Constitutional:      General: He is not in acute distress.    Appearance: Normal appearance.  HENT:     Head: Normocephalic and atraumatic.  Eyes:     General:        Right eye: No discharge.        Left eye: No discharge.  Cardiovascular:     Rate and Rhythm: Normal rate and regular rhythm.     Pulses: Normal pulses.   Pulmonary:     Effort: Pulmonary effort is normal. No respiratory distress.  Musculoskeletal:        General: No deformity.     Comments: Patient with no step-off or deformity of the back, or right shoulder.  Patient with tenderness to palpation of the paraspinous muscles of the right thoracic spine, as well as around the scapula, and humeral head of the right arm.  Patient with no crepitus on extension overhead or crossarm abduction.  Patient does have some pain with extension overhead, and crossarm adduction.  Intact strength 5 out of 5 bilateral upper and lower extremities.  Patient denies any sensory changes.  Skin:    General: Skin is warm and dry.     Capillary Refill: Capillary refill takes less than 2 seconds.  Neurological:     Mental Status: He is alert and oriented to person, place, and time.  Psychiatric:        Mood and Affect: Mood normal.        Behavior: Behavior normal.    ED Results / Procedures / Treatments   Labs (all labs ordered are listed, but only abnormal results are displayed) Labs Reviewed - No data to display  EKG None  Radiology No results found.  Procedures Procedures   Medications Ordered in ED Medications  ketorolac (TORADOL) 15 MG/ML injection 15 mg (15 mg Intramuscular Given 03/01/21 0717)  acetaminophen (TYLENOL) tablet 650 mg (650 mg Oral Given 03/01/21 4098)    ED Course  I have reviewed the triage vital signs and the nursing notes.  Pertinent labs & imaging results that were available during my care of the patient were reviewed by me and considered in my medical decision making (see chart for details).    MDM Rules/Calculators/A&P                         Overall well-appearing 40 year old male with 3 days of acute on chronic back pain secondary to high demand physically intensive job.  Patient is neurovascularly intact with good pulses throughout, and notably in the affected right arm.  Patient with no red flags for back pain  including recent fever, IV drug use, chronic corticosteroid use, history of cancer, saddle anesthesia, difficulty with urination, defecation.  We will optimize pain management with Celebrex to help with GI symptoms, scheduled Tylenol, Voltaren gel, as well as Robaxin.  Encouraged follow-up with orthopedics.  Encouraged rest, elevation, stretching and physical therapy.  We will control pain prior to discharge with Toradol, Tylenol.  Patient discharged in stable condition, return precautions given. Final Clinical Impression(s) / ED Diagnoses Final diagnoses:  Upper back pain on  right side  Right shoulder pain, unspecified chronicity    Rx / DC Orders ED Discharge Orders          Ordered    diclofenac Sodium (VOLTAREN) 1 % GEL  4 times daily        03/01/21 0712    celecoxib (CELEBREX) 50 MG capsule  2 times daily        03/01/21 0712    methocarbamol (ROBAXIN) 500 MG tablet  2 times daily        03/01/21 3491             Olene Floss, PA-C 03/01/21 0720    Pricilla Loveless, MD 03/02/21 [phone]

## 2021-03-01 NOTE — ED Triage Notes (Signed)
Patient arrives complaining of upper back pain for a week. Patient works at [employer]. No numbness or tingling.

## 2021-03-01 NOTE — Discharge Instructions (Addendum)
Please use Tylenol or ibuprofen for pain.  You may use 600 mg ibuprofen every 6 hours or 1000 mg of Tylenol every 6 hours.  You may choose to alternate between the 2.  This would be most effective.  Not to exceed 4 g of Tylenol within 24 hours.  Not to exceed 3200 mg ibuprofen 24 hours.  In lieu of ibuprofen, if you are having stomach issues, you can take the celebrex I am prescribing either 1 or 2 tablets at a time up to twice a day. In addition you can apply the voltaren gel up to 4 times daily to the affected area directly where it hurts.  Finally, you can take the muscle relaxant up to twice a day. 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  If your pain continues with no improvement I recommend following up with orthopedics as we discussed. If the pain gets much worse please return to the emergency department.

## 2021-03-12 ENCOUNTER — Emergency Department (HOSPITAL_COMMUNITY)
Admission: EM | Admit: 2021-03-12 | Discharge: 2021-03-12 | Disposition: A | Discharge status: Home / Self Care | Payer: BC Managed Care – PPO | Attending: Emergency Medicine | Admitting: Emergency Medicine

## 2021-03-12 ENCOUNTER — Encounter (HOSPITAL_COMMUNITY): Payer: Self-pay

## 2021-03-12 DIAGNOSIS — R1084 Generalized abdominal pain: Secondary | ICD-10-CM | POA: Insufficient documentation

## 2021-03-12 DIAGNOSIS — R109 Unspecified abdominal pain: Secondary | ICD-10-CM

## 2021-03-12 LAB — COMPREHENSIVE METABOLIC PANEL
ALT: 34 U/L (ref 0–44)
AST: 38 U/L (ref 15–41)
Albumin: 4.7 g/dL (ref 3.5–5.0)
Alkaline Phosphatase: 58 U/L (ref 38–126)
Anion gap: 5 (ref 5–15)
BUN: 12 mg/dL (ref 6–20)
CO2: 27 mmol/L (ref 22–32)
Calcium: 8.8 mg/dL — ABNORMAL LOW (ref 8.9–10.3)
Chloride: 102 mmol/L (ref 98–111)
Creatinine, Ser: 1.07 mg/dL (ref 0.61–1.24)
GFR, Estimated: >60 mL/min (ref >60)
Glucose, Bld: 93 mg/dL (ref 70–99)
Potassium: 3.9 mmol/L (ref 3.5–5.1)
Sodium: 134 mmol/L — ABNORMAL LOW (ref 135–145)
Total Bilirubin: 1.5 mg/dL — ABNORMAL HIGH (ref 0.3–1.2)
Total Protein: 8 g/dL (ref 6.5–8.1)

## 2021-03-12 LAB — CBC WITH DIFFERENTIAL/PLATELET
Abs Immature Granulocytes: 0.01 10*3/uL (ref 0.00–0.07)
Basophils Absolute: 0 10*3/uL (ref 0.0–0.1)
Basophils Relative: 1 %
Eosinophils Absolute: 0.3 10*3/uL (ref 0.0–0.5)
Eosinophils Relative: 5 %
HCT: 45.3 % (ref 39.0–52.0)
Hemoglobin: 15 g/dL (ref 13.0–17.0)
Immature Granulocytes: 0 %
Lymphocytes Relative: 32 %
Lymphs Abs: 1.7 10*3/uL (ref 0.7–4.0)
MCH: 29 pg (ref 26.0–34.0)
MCHC: 33.1 g/dL (ref 30.0–36.0)
MCV: 87.6 fL (ref 80.0–100.0)
Monocytes Absolute: 0.4 10*3/uL (ref 0.1–1.0)
Monocytes Relative: 7 %
Neutro Abs: 3 10*3/uL (ref 1.7–7.7)
Neutrophils Relative %: 55 %
Platelets: 324 10*3/uL (ref 150–400)
RBC: 5.17 MIL/uL (ref 4.22–5.81)
RDW: 11.3 % — ABNORMAL LOW (ref 11.5–15.5)
WBC: 5.4 10*3/uL (ref 4.0–10.5)
nRBC: 0 % (ref 0.0–0.2)

## 2021-03-12 LAB — LIPASE, BLOOD: Lipase: 39 U/L (ref 11–51)

## 2021-03-12 LAB — URINALYSIS, ROUTINE W REFLEX MICROSCOPIC
Bilirubin Urine: NEGATIVE
Glucose, UA: NEGATIVE mg/dL
Hgb urine dipstick: NEGATIVE
Ketones, ur: NEGATIVE mg/dL
Leukocytes,Ua: NEGATIVE
Nitrite: NEGATIVE
Protein, ur: NEGATIVE mg/dL
Specific Gravity, Urine: 1.016 (ref 1.005–1.030)
pH: 6 (ref 5.0–8.0)

## 2021-03-12 MED ORDER — ACETAMINOPHEN 500 MG PO TABS
1000.0000 mg | ORAL_TABLET | Freq: Once | ORAL | Status: AC
  Administered 2021-03-12: 1000 mg via ORAL
  Filled 2021-03-12: qty 2

## 2021-03-12 MED ORDER — DICYCLOMINE HCL 20 MG PO TABS: 20.0000 mg | ORAL_TABLET | Freq: Two times a day (BID) | ORAL | 0 refills | Status: AC | PRN

## 2021-03-12 MED ORDER — DICYCLOMINE HCL 10 MG PO CAPS
10.0000 mg | ORAL_CAPSULE | Freq: Once | ORAL | Status: AC
  Administered 2021-03-12: 10 mg via ORAL
  Filled 2021-03-12: qty 1

## 2021-03-12 NOTE — ED Provider Notes (Signed)
Emergency Medicine Provider Triage Evaluation Note  Andrew Galvan , a 40 y.o. male  was evaluated in triage.  Pt complains of abdominal pain.  Periumbilical, was constant for a week and then spontaneously stopped.  Started again acutely while he was at work today and is more severe.  No other associated symptoms with it, no prior abdominal surgeries.  Patient drinks alcohol maybe once a week.  Review of Systems  Positive: AP Negative: N,V,D, dysuria, hematuria  Physical Exam  BP (!) 135/96   Pulse 78   Temp 98.7 F (37.1 C)   Resp 17   SpO2 98%  Gen:   Awake, no distress   Resp:  Normal effort  MSK:   Moves extremities without difficulty  Other:  Vague left-sided abdominal pain and periumbilical pain.  No guarding or rigidity, no CVA tenderness  Medical Decision Making  Medically screening exam initiated at 2:29 PM.  Appropriate orders placed.  CINQUE BEGLEY was informed that the remainder of the evaluation will be completed by another provider, this initial triage assessment does not replace that evaluation, and the importance of remaining in the ED until their evaluation is complete.  Abdominal labs   Theron Arista, Cordelia Poche 03/12/21 1430    Jacalyn Lefevre, MD 03/12/21 [phone]

## 2021-03-12 NOTE — ED Provider Notes (Signed)
[facility]   CSN: [id number] Arrival date & time: 03/12/21  1400     History Chief Complaint  Patient presents with   Abdominal Pain    Andrew Galvan is a 40 y.o. male presenting for evaluation of abd pain.   Patient states today when he was at work he developed abdominal pain.  Is mostly around his umbilicus and towards the left side.  This occurred when he was pulling something heavy.  He states initially pain was constant, now it is intermittent.  Nothing makes it better or worse including movement.  No fevers, chills, cough, shortness of breath, nausea, vomiting, urinary symptoms, normal bowel movements.  Pain is unchanged with p.o. intake.  Pain is unchanged with urination or bowel movements.  No history of similar.  Patient had back pain last week which was thought to be musculoskeletal, resolved with Tylenol.  No previous abdominal surgeries.  HPI     History reviewed. No pertinent past medical history.  There are no problems to display for this patient.   No past surgical history on file.     Family History  Problem Relation Age of Onset   Cancer Mother     Social History   Tobacco Use   Smoking status: Never   Smokeless tobacco: Never  Vaping Use   Vaping Use: Every day  Substance Use Topics   Alcohol use: Yes    Comment: occ   Drug use: Yes    Types: Marijuana    Home Medications Prior to Admission medications   Medication Sig Start Date End Date Taking? Authorizing Provider  dicyclomine (BENTYL) 20 MG tablet Take 1 tablet (20 mg total) by mouth 2 (two) times daily as needed for spasms. 03/12/21  Yes Elim Economou, PA-C  amoxicillin (AMOXIL) 500 MG capsule Take 2 capsules (1,000 mg total) by mouth 2 (two) times daily. 08/07/17   Ward, Chase Picket, PA-C  azithromycin (ZITHROMAX) 250 MG tablet Take 1 tablet (250 mg total) by mouth daily. Take first 2 tablets together, then 1 every day until finished.  08/07/17   Ward, Chase Picket, PA-C  celecoxib (CELEBREX) 50 MG capsule Take 1 capsule (50 mg total) by mouth 2 (two) times daily. 03/01/21   Prosperi, Christian H, PA-C  cyclobenzaprine (FLEXERIL) 10 MG tablet Take 1 tablet (10 mg total) by mouth 2 (two) times daily as needed for muscle spasms. 09/12/17   McDonald, Mia A, PA-C  diclofenac Sodium (VOLTAREN) 1 % GEL Apply 2 g topically 4 (four) times daily. 03/01/21   Prosperi, Christian H, PA-C  methocarbamol (ROBAXIN) 500 MG tablet Take 1 tablet (500 mg total) by mouth 2 (two) times daily. 03/01/21   Prosperi, Christian H, PA-C  ondansetron (ZOFRAN ODT) 4 MG disintegrating tablet Take 1 tablet (4 mg total) by mouth every 8 (eight) hours as needed for nausea or vomiting. 08/07/17   Ward, Chase Picket, PA-C  predniSONE (DELTASONE) 20 MG tablet Take 3 tablets (60 mg total) by mouth daily. Patient not taking: Reported on 07/18/2016 01/21/16   Emi Holes, PA-C  Pseudoephedrine-APAP-DM (DAYQUIL PO) Take 1 capsule by mouth daily as needed (cough).    [provider]  traMADol (ULTRAM) 50 MG tablet Take 1 tablet (50 mg total) by mouth every 6 (six) hours as needed. Patient not taking: Reported on 07/18/2016 06/09/15   Danelle Berry, PA-C    Allergies    Dairy aid [tilactase]  Review of Systems   Review of  Systems  Gastrointestinal:  Positive for abdominal pain (Intermittent).  All other systems reviewed and are negative.  Physical Exam Updated Vital Signs BP (!) 140/106 (BP Location: Right Arm)   Pulse 72   Temp 98.7 F (37.1 C)   Resp 16   SpO2 99%   Physical Exam Vitals and nursing note reviewed.  Constitutional:      General: He is not in acute distress.    Appearance: Normal appearance.     Comments: Resting in the bed in no acute distress  HENT:     Head: Normocephalic and atraumatic.  Eyes:     Conjunctiva/sclera: Conjunctivae normal.     Pupils: Pupils are equal, round, and reactive to light.  Cardiovascular:     Rate  and Rhythm: Normal rate and regular rhythm.     Pulses: Normal pulses.  Pulmonary:     Effort: Pulmonary effort is normal. No respiratory distress.     Breath sounds: Normal breath sounds. No wheezing.     Comments: Speaking in full sentences.  Clear lung sounds in all fields. Abdominal:     General: There is no distension.     Palpations: Abdomen is soft.     Tenderness: There is generalized abdominal tenderness.     Comments: Mild diffuse tenderness palpation the abdomen.  Soft without rigidity, guarding, distention.  Negative rebound.  No peritonitis  Musculoskeletal:        General: Normal range of motion.     Cervical back: Normal range of motion and neck supple.  Skin:    General: Skin is warm and dry.     Capillary Refill: Capillary refill takes less than 2 seconds.  Neurological:     Mental Status: He is alert and oriented to person, place, and time.  Psychiatric:        Mood and Affect: Mood and affect normal.        Speech: Speech normal.        Behavior: Behavior normal.    ED Results / Procedures / Treatments   Labs (all labs ordered are listed, but only abnormal results are displayed) Labs Reviewed  CBC WITH DIFFERENTIAL/PLATELET - Abnormal; Notable for the following components:      Result Value   RDW 11.3 (*)    All other components within normal limits  COMPREHENSIVE METABOLIC PANEL - Abnormal; Notable for the following components:   Sodium 134 (*)    Calcium 8.8 (*)    Total Bilirubin 1.5 (*)    All other components within normal limits  LIPASE, BLOOD  URINALYSIS, ROUTINE W REFLEX MICROSCOPIC    EKG None  Radiology No results found.  Procedures Procedures   Medications Ordered in ED Medications  dicyclomine (BENTYL) capsule 10 mg (has no administration in time range)  acetaminophen (TYLENOL) tablet 1,000 mg (has no administration in time range)    ED Course  I have reviewed the triage vital signs and the nursing notes.  Pertinent labs &  imaging results that were available during my care of the patient were reviewed by me and considered in my medical decision making (see chart for details).    MDM Rules/Calculators/A&P                           Patient presenting for evaluation abdominal pain.  On exam, patient peers nontoxic.  Pain is intermittent described as a cramping pain.  No fevers, chills, nausea, vomiting.  Consider MSK cause.  Consider intestinal spasm.  Without fever or vomiting or focal pain, lower suspicion for intra-abdominal infection.  Labs obtained from triage interpreted by me, no leukocytosis.  Kidney, liver, pancreatic function normal.  Urine without signs of infection or blood.  Doubt kidney stone or UTI.  Discussed findings with patient.  Discussed that at this time, there does not appear to be an intra-abdominal infection, person, suction, surgical abdomen.  Discussed he does not appear to need hospitalization or further emergent management.  Discussed symptomatic management, follow-up with PCP.  At this time, patient appears safe for discharge.  Return precautions given.  Patient states he understands and agrees to plan.   Final Clinical Impression(s) / ED Diagnoses Final diagnoses:  Intermittent abdominal pain    Rx / DC Orders ED Discharge Orders          Ordered    dicyclomine (BENTYL) 20 MG tablet  2 times daily PRN        03/12/21 2235             Alveria Apley, PA-C 03/12/21 2308    Terrilee Files, MD 03/13/21 [phone]

## 2021-03-12 NOTE — ED Notes (Signed)
Patient has a urine culture in the main lab 

## 2021-03-12 NOTE — ED Triage Notes (Signed)
Pt c/o abdominal pain that started today while at work. Pt denies any other symptoms.

## 2021-03-12 NOTE — Discharge Instructions (Signed)
Use bentyl as needed for abdominal pain or cramping.  Use tylenol as needed for pain.  Follow up with your primary care doctor as needed for recheck if pain is not improving.  Return to the ER if you develop fever, vomiting, blood in your stool, inability to urinate, blood in your urine or any new, worsening, or concerning symptoms.

## 2021-03-23 ENCOUNTER — Other Ambulatory Visit: Payer: Self-pay | Admitting: Infectious Diseases

## 2021-03-23 DIAGNOSIS — R1013 Epigastric pain: Secondary | ICD-10-CM

## 2021-03-24 ENCOUNTER — Ambulatory Visit
Admission: RE | Admit: 2021-03-24 | Discharge: 2021-03-24 | Disposition: A | Discharge status: Home / Self Care | Payer: BC Managed Care – PPO | Source: Ambulatory Visit | Attending: Infectious Diseases | Admitting: Infectious Diseases

## 2021-03-24 ENCOUNTER — Ambulatory Visit
Admission: RE | Admit: 2021-03-24 | Payer: BC Managed Care – PPO | Source: Home / Self Care | Admitting: Infectious Diseases

## 2021-03-24 DIAGNOSIS — R1013 Epigastric pain: Secondary | ICD-10-CM

## 2021-03-24 MED ORDER — IOHEXOL 300 MG/ML  SOLN
100.0000 mL | Freq: Once | INTRAMUSCULAR | Status: AC | PRN
  Administered 2021-03-24: 100 mL via INTRAVENOUS

## 2021-05-01 ENCOUNTER — Ambulatory Visit: Payer: Self-pay | Admitting: Urology
